# Patient Record
Sex: Female | Born: 1965
Health system: Southern US, Community
[De-identification: ages and names within clinical notes are randomized; demographics above are authoritative.]

## PROBLEM LIST (undated history)

## (undated) DIAGNOSIS — E669 Obesity, unspecified: Secondary | ICD-10-CM

## (undated) DIAGNOSIS — E781 Pure hyperglyceridemia: Secondary | ICD-10-CM

## (undated) HISTORY — PX: MANDIBLE FRACTURE SURGERY: SHX706

## (undated) HISTORY — DX: Obesity, unspecified: E66.9

## (undated) HISTORY — PX: EYE SURGERY: SHX253

---
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
## 2014-05-28 LAB — HM PAP SMEAR

## 2015-09-26 LAB — HM COLONOSCOPY

## 2015-12-18 ENCOUNTER — Encounter (HOSPITAL_COMMUNITY): Payer: Self-pay | Admitting: *Deleted

## 2015-12-18 ENCOUNTER — Emergency Department (HOSPITAL_COMMUNITY)
Admission: EM | Admit: 2015-12-18 | Discharge: 2015-12-18 | Disposition: A | Discharge status: Home / Self Care | Payer: BLUE CROSS/BLUE SHIELD | Attending: Emergency Medicine | Admitting: Emergency Medicine

## 2015-12-18 ENCOUNTER — Emergency Department (HOSPITAL_COMMUNITY): Payer: BLUE CROSS/BLUE SHIELD

## 2015-12-18 DIAGNOSIS — R079 Chest pain, unspecified: Secondary | ICD-10-CM | POA: Diagnosis present

## 2015-12-18 LAB — I-STAT TROPONIN, ED
TROPONIN I, POC: 0 ng/mL (ref 0.00–0.08)
Troponin i, poc: 0 ng/mL (ref 0.00–0.08)

## 2015-12-18 LAB — CBC
HEMATOCRIT: 41.8 % (ref 36.0–46.0)
HEMOGLOBIN: 13.7 g/dL (ref 12.0–15.0)
MCH: 29 pg (ref 26.0–34.0)
MCHC: 32.8 g/dL (ref 30.0–36.0)
MCV: 88.4 fL (ref 78.0–100.0)
Platelets: 257 10*3/uL (ref 150–400)
RBC: 4.73 MIL/uL (ref 3.87–5.11)
RDW: 12.8 % (ref 11.5–15.5)
WBC: 8.6 10*3/uL (ref 4.0–10.5)

## 2015-12-18 LAB — BASIC METABOLIC PANEL
ANION GAP: 8 (ref 5–15)
BUN: 13 mg/dL (ref 6–20)
CO2: 26 mmol/L (ref 22–32)
Calcium: 9.6 mg/dL (ref 8.9–10.3)
Chloride: 109 mmol/L (ref 101–111)
Creatinine, Ser: 0.67 mg/dL (ref 0.44–1.00)
GFR calc Af Amer: >60 mL/min (ref >60)
Glucose, Bld: 123 mg/dL — ABNORMAL HIGH (ref 65–99)
POTASSIUM: 3.7 mmol/L (ref 3.5–5.1)
SODIUM: 143 mmol/L (ref 135–145)

## 2015-12-18 NOTE — Discharge Instructions (Signed)

## 2015-12-18 NOTE — ED Triage Notes (Signed)
Pt states she woke up with of discomfort in the middle of her chest and it will not go away.

## 2015-12-18 NOTE — ED Provider Notes (Signed)
Emergency Department Provider Note  Time seen: Approximately 7:42 AM  I have reviewed the triage vital signs and the nursing notes.   HISTORY  Chief Complaint Chest Pain   HPI Catherine Haley is a 50 y.o. female with no significant past medical history presents to the emergency department for evaluation of chest pressure that woke the patient from sleep at approximately 4 AM today. Patient states she awoke with a central chest pressure that was nonradiating. It continued despite taking Rolaids. She denies any associated dyspnea, diaphoresis, jaw or neck symptoms. No prior history of similar pain. Patient is a nonsmoker. She does have a positive family history of acute MI. She reports symptoms spontaneously resolved in route to the emergency department. She has not had a return of chest pressure. No symptom worsening with walking or other minimal exertion.   History reviewed. No pertinent past medical history.  There are no active problems to display for this patient.   Past Surgical History:  Procedure Laterality Date  . MANDIBLE FRACTURE SURGERY      Current Outpatient Rx  . Order #: [id number] Class: Historical Med    Allergies Review of patient's allergies indicates no known allergies.  No family history on file.  Social History Social History  Substance Use Topics  . Smoking status: Never Smoker  . Smokeless tobacco: Not on file  . Alcohol use No    Review of Systems  Constitutional: No fever/chills Eyes: No visual changes. ENT: No sore throat. Cardiovascular: Positive chest pain. Respiratory: Denies shortness of breath. Gastrointestinal: No abdominal pain.  No nausea, no vomiting.  No diarrhea.  No constipation. Genitourinary: Negative for dysuria. Musculoskeletal: Negative for back pain. Skin: Negative for rash. Neurological: Negative for headaches, focal weakness or numbness.  10-point ROS otherwise  negative.  ____________________________________________   PHYSICAL EXAM:  VITAL SIGNS: ED Triage Vitals  Enc Vitals Group     BP 12/18/15 0503 (!) 164/108     Pulse Rate 12/18/15 0503 63     Resp 12/18/15 0503 17     Temp 12/18/15 0503 98.3 F (36.8 C)     Temp Source 12/18/15 0503 Oral     SpO2 12/18/15 0503 96 %     Weight 12/18/15 0503 170 lb (77.1 kg)     Height 12/18/15 0503  (1.626 m)     Pain Score 12/18/15 0510 5   Constitutional: Alert and oriented. Well appearing and in no acute distress. Eyes: Conjunctivae are normal. PERRL. EOMI. Head: Atraumatic. Nose: No congestion/rhinnorhea. Mouth/Throat: Mucous membranes are moist.  Oropharynx non-erythematous. Neck: No stridor.   Cardiovascular: Normal rate, regular rhythm. Good peripheral circulation. Grossly normal heart sounds.   Respiratory: Normal respiratory effort.  No retractions. Lungs CTAB. Gastrointestinal: Soft and nontender. No distention.  Musculoskeletal: No lower extremity tenderness nor edema. No gross deformities of extremities. Neurologic:  Normal speech and language. No gross focal neurologic deficits are appreciated.  Skin:  Skin is warm, dry and intact. No rash noted. Psychiatric: Mood and affect are normal. Speech and behavior are normal.  ____________________________________________   LABS (all labs ordered are listed, but only abnormal results are displayed)  Labs Reviewed  BASIC METABOLIC PANEL - Abnormal; Notable for the following:       Result Value   Glucose, Bld 123 (*)    All other components within normal limits  CBC  I-STAT TROPOININ, ED  I-STAT TROPOININ, ED   ____________________________________________  EKG  Reviewed in MUSE. Normal EKG.  ____________________________________________  RADIOLOGY  Dg Chest 2 View  Result Date: 12/18/2015 CLINICAL DATA:  Chest pain beginning over night. EXAM: CHEST  2 VIEW COMPARISON:  None available for comparison at time of study  interpretation. FINDINGS: Cardiomediastinal silhouette is normal. The lungs are clear without pleural effusions or focal consolidations. Trachea projects midline and there is no pneumothorax. Soft tissue planes and included osseous structures are non-suspicious. IMPRESSION: No acute cardiopulmonary process. Electronically Signed   By: Awilda Metro M.D.   On: 12/18/2015 05:51   ____________________________________________   PROCEDURES  Procedure(s) performed:   Procedures  None ____________________________________________   INITIAL IMPRESSION / ASSESSMENT AND PLAN / ED COURSE  Pertinent labs & imaging results that were available during my care of the patient were reviewed by me and considered in my medical decision making (see chart for details).  Patient resents the emergency department for evaluation of chest pressure. She has risk factors for coronary artery disease including age and family history. HEART score 2. Pain resolved spontaneously prior to ED presentation. The patient has a normal EKG. She is currently pain-free. Plan for repeat troponin at 3 hour mark and reassessment.  Differential includes all life-threatening causes for chest pain. This includes but is not exclusive to acute coronary syndrome, aortic dissection, pulmonary embolism, cardiac tamponade, community-acquired pneumonia, pericarditis, musculoskeletal chest wall pain, etc.  Likely discharge with PCP follow up for consideration of outpatient stress if repeat troponin is negative.   09:11 AM Patient remains chest pain-free. Repeat troponin negative. Discussed return precautions for chest pain. Provide a cardiology follow-up as needed.  At this time, I do not feel there is any life-threatening condition present. I have reviewed and discussed all results (EKG, imaging, lab, urine as appropriate), exam findings with patient. I have reviewed nursing notes and appropriate previous records.  I feel the patient is  safe to be discharged home without further emergent workup. Discussed usual and customary return precautions. Patient and family (if present) verbalize understanding and are comfortable with this plan.  Patient will follow-up with their primary care provider. If they do not have a primary care provider, information for follow-up has been provided to them. All questions have been answered.  ____________________________________________  FINAL CLINICAL IMPRESSION(S) / ED DIAGNOSES  Final diagnoses:  Nonspecific chest pain     MEDICATIONS GIVEN DURING THIS VISIT:  None  NEW OUTPATIENT MEDICATIONS STARTED DURING THIS VISIT:  None   Note:  This document was prepared using Dragon voice recognition software and may include unintentional dictation errors.  Alona Bene, MD Emergency Medicine   Maia Plan, MD 12/18/15 Ernest[facility] [place]

## 2016-06-01 DIAGNOSIS — Z803 Family history of malignant neoplasm of breast: Secondary | ICD-10-CM | POA: Diagnosis not present

## 2016-06-01 DIAGNOSIS — Z1231 Encounter for screening mammogram for malignant neoplasm of breast: Secondary | ICD-10-CM | POA: Diagnosis not present

## 2016-07-03 DIAGNOSIS — M9902 Segmental and somatic dysfunction of thoracic region: Secondary | ICD-10-CM | POA: Diagnosis not present

## 2016-07-03 DIAGNOSIS — M9901 Segmental and somatic dysfunction of cervical region: Secondary | ICD-10-CM | POA: Diagnosis not present

## 2016-07-03 DIAGNOSIS — M50122 Cervical disc disorder at C5-C6 level with radiculopathy: Secondary | ICD-10-CM | POA: Diagnosis not present

## 2016-07-03 DIAGNOSIS — M5384 Other specified dorsopathies, thoracic region: Secondary | ICD-10-CM | POA: Diagnosis not present

## 2016-07-11 DIAGNOSIS — M7531 Calcific tendinitis of right shoulder: Secondary | ICD-10-CM | POA: Diagnosis not present

## 2016-07-12 DIAGNOSIS — M47814 Spondylosis without myelopathy or radiculopathy, thoracic region: Secondary | ICD-10-CM | POA: Diagnosis not present

## 2016-07-12 DIAGNOSIS — M25511 Pain in right shoulder: Secondary | ICD-10-CM | POA: Diagnosis not present

## 2016-10-03 DIAGNOSIS — Z7189 Other specified counseling: Secondary | ICD-10-CM | POA: Diagnosis not present

## 2017-01-14 DIAGNOSIS — H9201 Otalgia, right ear: Secondary | ICD-10-CM | POA: Diagnosis not present

## 2017-05-28 LAB — HM MAMMOGRAPHY

## 2017-06-03 DIAGNOSIS — Z1231 Encounter for screening mammogram for malignant neoplasm of breast: Secondary | ICD-10-CM | POA: Diagnosis not present

## 2017-06-03 DIAGNOSIS — Z803 Family history of malignant neoplasm of breast: Secondary | ICD-10-CM | POA: Diagnosis not present

## 2017-07-17 DIAGNOSIS — Z712 Person consulting for explanation of examination or test findings: Secondary | ICD-10-CM | POA: Diagnosis not present

## 2017-07-17 DIAGNOSIS — Z23 Encounter for immunization: Secondary | ICD-10-CM | POA: Diagnosis not present

## 2017-11-06 DIAGNOSIS — M7581 Other shoulder lesions, right shoulder: Secondary | ICD-10-CM | POA: Diagnosis not present

## 2018-01-28 ENCOUNTER — Other Ambulatory Visit (HOSPITAL_COMMUNITY)
Admission: RE | Admit: 2018-01-28 | Discharge: 2018-01-28 | Disposition: A | Discharge status: Home / Self Care | Payer: BLUE CROSS/BLUE SHIELD | Source: Ambulatory Visit | Attending: Family Medicine | Admitting: Family Medicine

## 2018-01-28 ENCOUNTER — Other Ambulatory Visit: Payer: Self-pay

## 2018-01-28 ENCOUNTER — Encounter: Payer: Self-pay | Admitting: Family Medicine

## 2018-01-28 ENCOUNTER — Ambulatory Visit: Payer: BLUE CROSS/BLUE SHIELD | Admitting: Family Medicine

## 2018-01-28 VITALS — BP 120/86 | HR 76 | Temp 98.1°F | Ht 64.5 in | Wt 189.0 lb

## 2018-01-28 DIAGNOSIS — E669 Obesity, unspecified: Secondary | ICD-10-CM | POA: Diagnosis not present

## 2018-01-28 DIAGNOSIS — Z124 Encounter for screening for malignant neoplasm of cervix: Secondary | ICD-10-CM

## 2018-01-28 DIAGNOSIS — Z0000 Encounter for general adult medical examination without abnormal findings: Secondary | ICD-10-CM

## 2018-01-28 HISTORY — DX: Obesity, unspecified: E66.9

## 2018-01-28 LAB — COMPREHENSIVE METABOLIC PANEL
ALT: 33 U/L (ref 0–35)
AST: 24 U/L (ref 0–37)
Albumin: 4.8 g/dL (ref 3.5–5.2)
Alkaline Phosphatase: 72 U/L (ref 39–117)
BUN: 12 mg/dL (ref 6–23)
CHLORIDE: 104 meq/L (ref 96–112)
CO2: 28 meq/L (ref 19–32)
Calcium: 10.4 mg/dL (ref 8.4–10.5)
Creatinine, Ser: 0.72 mg/dL (ref 0.40–1.20)
GFR: 89.93 mL/min (ref >60.00)
GLUCOSE: 92 mg/dL (ref 70–99)
POTASSIUM: 4.3 meq/L (ref 3.5–5.1)
SODIUM: 141 meq/L (ref 135–145)
TOTAL PROTEIN: 7.1 g/dL (ref 6.0–8.3)
Total Bilirubin: 0.6 mg/dL (ref 0.2–1.2)

## 2018-01-28 LAB — CBC WITH DIFFERENTIAL/PLATELET
Basophils Absolute: 0.1 10*3/uL (ref 0.0–0.1)
Basophils Relative: 0.5 % (ref 0.0–3.0)
EOS PCT: 1.8 % (ref 0.0–5.0)
Eosinophils Absolute: 0.2 10*3/uL (ref 0.0–0.7)
HCT: 41.5 % (ref 36.0–46.0)
Hemoglobin: 14.3 g/dL (ref 12.0–15.0)
LYMPHS ABS: 3 10*3/uL (ref 0.7–4.0)
Lymphocytes Relative: 29.8 % (ref 12.0–46.0)
MCHC: 34.5 g/dL (ref 30.0–36.0)
MCV: 85.5 fl (ref 78.0–100.0)
MONO ABS: 0.9 10*3/uL (ref 0.1–1.0)
Monocytes Relative: 9.5 % (ref 3.0–12.0)
NEUTROS ABS: 5.8 10*3/uL (ref 1.4–7.7)
NEUTROS PCT: 58.4 % (ref 43.0–77.0)
PLATELETS: 318 10*3/uL (ref 150.0–400.0)
RBC: 4.85 Mil/uL (ref 3.87–5.11)
RDW: 13.7 % (ref 11.5–15.5)
WBC: 10 10*3/uL (ref 4.0–10.5)

## 2018-01-28 LAB — LIPID PANEL
CHOL/HDL RATIO: 5
Cholesterol: 170 mg/dL (ref 0–200)
HDL: 31.6 mg/dL — AB (ref >39.00)
NonHDL: 138.76
Triglycerides: 268 mg/dL — ABNORMAL HIGH (ref 0.0–149.0)
VLDL: 53.6 mg/dL — AB (ref 0.0–40.0)

## 2018-01-28 LAB — LDL CHOLESTEROL, DIRECT: LDL DIRECT: 119 mg/dL

## 2018-01-28 NOTE — Patient Instructions (Signed)
Please return in 12 months for your annual complete physical; please come fasting.  I will release your lab results to you on your MyChart account with further instructions. Please reply with any questions.    It was a pleasure meeting you today! Thank you for choosing us to meet your healthcare needs! I truly look forward to working with you. If you have any questions or concerns, please send me a message via Mychart or call the office at [phone].  Please do these things to maintain good health!   Exercise at least 30-45 minutes a day,  4-5 days a week.   Eat a low-fat diet with lots of fruits and vegetables, up to 7-9 servings per day.  Drink plenty of water daily. Try to drink 8 8oz glasses per day.  Seatbelts can save your life. Always wear your seatbelt.  Place Smoke Detectors on every level of your home and check batteries every year.  Schedule an appointment with an eye doctor for an eye exam every 1-2 years  Safe sex - use condoms to protect yourself from STDs if you could be exposed to these types of infections. Use birth control if you do not want to become pregnant and are sexually active.  Avoid heavy alcohol use. If you drink, keep it to less than 2 drinks/day and not every day.  Health Care Power of Attorney.  Choose someone you trust that could speak for you if you became unable to speak for yourself.  Depression is common in our stressful world.If you're feeling down or losing interest in things you normally enjoy, please come in for a visit.  If anyone is threatening or hurting you, please get help. Physical or Emotional Violence is never OK.  

## 2018-01-28 NOTE — Progress Notes (Signed)
Subjective  Chief Complaint  Patient presents with  . Establish Care    Last saw Andria Meuse at [place], Last physical in 2016  . Annual Exam    Pap today, Patient is not fasting, declines flu shot     HPI: Catherine Haley is a 52 y.o. female who presents to [facility] today for a Female Wellness Visit.   Wellness Visit: annual visit with health maintenance review and exam with Pap   Happy healthy 52 yo postmenopausal married female for cpe with pap   Moved back to [place] from [place] 3 years ago. No health concerns.   Declines flu vaccinations at this time.   Reports nl colonoscopy in 2017; not certain where she had it done.   Assessment  1. Annual physical exam   2. Cervical cancer screening   3. Obesity (BMI 30-39.9)      Plan  Female Wellness Visit:  Age appropriate Health Maintenance and Prevention measures were discussed with patient. Included topics are cancer screening recommendations, ways to keep healthy (see AVS) including dietary and exercise recommendations, regular eye and dental care, use of seat belts, and avoidance of moderate alcohol use and tobacco use. Pap with hpv [place] done. mammo nl. crc screen up to date  BMI: discussed patient's BMI and encouraged positive lifestyle modifications to help get to or maintain a target BMI.  HM needs and immunizations were addressed and ordered. See below for orders. See HM and immunization section for updates.  Routine labs and screening tests ordered including cmp, cbc and lipids where appropriate.  Discussed recommendations regarding Vit D and calcium supplementation (see AVS)  Follow up: Return in about 1 year (around 01/29/2019) for complete physical.   Orders Placed This Encounter  Procedures  . HM MAMMOGRAPHY  . HM PAP SMEAR  . CBC with Differential/Platelet  . Comprehensive metabolic panel  . Lipid panel  . HIV antibody  . HM COLONOSCOPY   No orders of the defined types were  placed in this encounter.    Lifestyle: Body mass index is 31.94 kg/m. Wt Readings from Last 3 Encounters:  01/28/18 189 lb (85.7 kg)  12/18/15 170 lb (77.1 kg)   Diet: general Exercise: intermittently,   Patient Active Problem List   Diagnosis Date Noted  . Obesity (BMI 30-39.9) 01/28/2018   Health Maintenance  Topic Date Due  . HIV Screening  09/[birth year]/1979  . PAP SMEAR  05/28/2017  . INFLUENZA VACCINE  09/17/2018 (Originally 12/26/2017)  . MAMMOGRAM  05/28/2018  . COLONOSCOPY  09/25/2025  . TETANUS/TDAP  06/29/2027   Immunization History  Administered Date(s) Administered  . Tdap 06/28/2017   We updated and reviewed the patient's past history in detail and it is documented below. Allergies: Patient has No Known Allergies. Past Medical History Patient  has a past medical history of Obesity (BMI 30-39.9) (01/28/2018). Past Surgical History Patient  has a past surgical history that includes Mandible fracture surgery. Family History: Patient family history includes Alcohol abuse in her paternal grandmother; Arthritis in her father; Breast cancer in her mother; Colon cancer (age of onset: 31) in her father; Diverticulitis in her mother; Heart disease in her maternal grandfather, maternal grandmother, paternal grandfather, and paternal grandmother; Heart disease (age of onset: 30) in her father; Heart disease (age of onset: 63) in her mother; Hyperlipidemia in her father; Hypertension in her brother and father. Social History:  Patient  reports that she has never smoked. She has never used smokeless  tobacco. She reports that she does not drink alcohol or use drugs.  Review of Systems: Constitutional: negative for fever or malaise Ophthalmic: negative for photophobia, double vision or loss of vision Cardiovascular: negative for chest pain, dyspnea on exertion, or new LE swelling Respiratory: negative for SOB or persistent cough Gastrointestinal: negative for abdominal pain, change  in bowel habits or melena Genitourinary: negative for dysuria or gross hematuria, no abnormal uterine bleeding or disharge Musculoskeletal: negative for new gait disturbance or muscular weakness Integumentary: negative for new or persistent rashes, no breast lumps Neurological: negative for TIA or stroke symptoms Psychiatric: negative for SI or delusions Allergic/Immunologic: negative for hives  Patient Care Team    Relationship Specialty Notifications Start End  Willow Ora, MD PCP - General Family Medicine  01/28/18   Mammography, [facility]  Diagnostic Radiology  01/28/18     Objective  Vitals: BP 120/86   Pulse 76   Temp 98.1 F (36.7 C)   Ht 5' 4.5" (1.638 m)   Wt 189 lb (85.7 kg)   SpO2 98%   BMI 31.94 kg/m  General:  Well developed, well nourished, no acute distress  Psych:  Alert and orientedx3,normal mood and affect HEENT:  Normocephalic, atraumatic, non-icteric sclera, PERRL, oropharynx is clear without mass or exudate, supple neck without adenopathy, mass or thyromegaly Cardiovascular:  Normal S1, S2, RRR without gallop, rub or murmur, nondisplaced PMI Respiratory:  Good breath sounds bilaterally, CTAB with normal respiratory effort Gastrointestinal: normal bowel sounds, soft, non-tender, no noted masses. No HSM MSK: no deformities, contusions. Joints are without erythema or swelling. Spine and CVA region are nontender Skin:  Warm, no rashes or suspicious lesions noted Neurologic:    Mental status is normal. CN 2-11 are normal. Gross motor and sensory exams are normal. Normal gait. No tremor Breast Exam: No mass, skin retraction or nipple discharge is appreciated in either breast. No axillary adenopathy. Fibrocystic changes are not noted Pelvic Exam: Normal external genitalia, no vulvar or vaginal lesions present. Clear stenotic cervix w/o CMT. Bimanual exam reveals a nontender fundus w/o masses, nl size. No adnexal masses present. No inguinal adenopathy. A PAP smear was  performed.   Commons side effects, risks, benefits, and alternatives for medications and treatment plan prescribed today were discussed, and the patient expressed understanding of the given instructions. Patient is instructed to call or message via MyChart if he/she has any questions or concerns regarding our treatment plan. No barriers to understanding were identified. We discussed Red Flag symptoms and signs in detail. Patient expressed understanding regarding what to do in case of urgent or emergency type symptoms.   Medication list was reconciled, printed and provided to the patient in AVS. Patient instructions and summary information was reviewed with the patient as documented in the AVS. This note was prepared with assistance of Dragon voice recognition software. Occasional wrong-word or sound-a-like substitutions may have occurred due to the inherent limitations of voice recognition software

## 2018-01-29 LAB — CYTOLOGY - PAP
Diagnosis: NEGATIVE
HPV: NOT DETECTED

## 2018-01-29 LAB — HIV ANTIBODY (ROUTINE TESTING W REFLEX): HIV 1&2 Ab, 4th Generation: NONREACTIVE

## 2018-01-29 NOTE — Progress Notes (Signed)
Please call patient: I have reviewed his/her lab results. Everything looks good. Pap smear is normal and HPV testing is negative so can recheck in 5 years. Labs are normal although a low fat diet is recommended to avoid increasing cholesterol levels. Her levels are currently OK although triglycerides were a little high.

## 2018-03-01 DIAGNOSIS — J00 Acute nasopharyngitis [common cold]: Secondary | ICD-10-CM | POA: Diagnosis not present

## 2018-06-06 DIAGNOSIS — Z803 Family history of malignant neoplasm of breast: Secondary | ICD-10-CM | POA: Diagnosis not present

## 2018-06-06 DIAGNOSIS — Z1231 Encounter for screening mammogram for malignant neoplasm of breast: Secondary | ICD-10-CM | POA: Diagnosis not present

## 2018-06-06 LAB — HM MAMMOGRAPHY

## 2018-06-09 ENCOUNTER — Encounter: Payer: Self-pay | Admitting: *Deleted

## 2018-07-26 DIAGNOSIS — R05 Cough: Secondary | ICD-10-CM | POA: Diagnosis not present

## 2018-07-26 DIAGNOSIS — J069 Acute upper respiratory infection, unspecified: Secondary | ICD-10-CM | POA: Diagnosis not present

## 2019-01-28 ENCOUNTER — Telehealth: Payer: Self-pay | Admitting: Family Medicine

## 2019-01-28 NOTE — Telephone Encounter (Signed)
Pt is healthy, she can be scheduled in a 45min spot

## 2019-01-28 NOTE — Telephone Encounter (Signed)
Copied from [place] [id number]. Topic: General - Inquiry >> Jan 28, 2019 11:48 AM Richardo Priest, NT wrote: Reason for CRM: Patient called in and physical for 9/4 has been rescheduled to 11/2. Patient is wanting a call as she is not wanting to wait that long and would prefer to come in the morning. Please advise if sooner appointment is available.    Would Dr. Jonni Sanger allow me to either schedule a 20 minute appt or to schedule back to back physicals so we can work pt in sooner? She was originally scheduled for this Friday 9/4 at 8am and had to be rescheduled due to Dr. Tamela Oddi schedule change. Please let me know! Thank you

## 2019-01-30 ENCOUNTER — Encounter: Payer: BLUE CROSS/BLUE SHIELD | Admitting: Family Medicine

## 2019-02-05 ENCOUNTER — Other Ambulatory Visit: Payer: Self-pay

## 2019-02-05 ENCOUNTER — Ambulatory Visit (INDEPENDENT_AMBULATORY_CARE_PROVIDER_SITE_OTHER): Payer: BC Managed Care – PPO | Admitting: Family Medicine

## 2019-02-05 ENCOUNTER — Encounter: Payer: Self-pay | Admitting: Family Medicine

## 2019-02-05 ENCOUNTER — Ambulatory Visit: Payer: BC Managed Care – PPO | Admitting: Family Medicine

## 2019-02-05 VITALS — BP 128/76 | HR 70 | Temp 98.3°F | Resp 16 | Ht 64.5 in | Wt 188.8 lb

## 2019-02-05 DIAGNOSIS — Z0000 Encounter for general adult medical examination without abnormal findings: Secondary | ICD-10-CM

## 2019-02-05 LAB — LDL CHOLESTEROL, DIRECT: Direct LDL: 107 mg/dL

## 2019-02-05 LAB — CBC WITH DIFFERENTIAL/PLATELET
Basophils Absolute: 0 10*3/uL (ref 0.0–0.1)
Basophils Relative: 0.4 % (ref 0.0–3.0)
Eosinophils Absolute: 0.2 10*3/uL (ref 0.0–0.7)
Eosinophils Relative: 1.8 % (ref 0.0–5.0)
HCT: 41 % (ref 36.0–46.0)
Hemoglobin: 14 g/dL (ref 12.0–15.0)
Lymphocytes Relative: 35.2 % (ref 12.0–46.0)
Lymphs Abs: 3.1 10*3/uL (ref 0.7–4.0)
MCHC: 34.3 g/dL (ref 30.0–36.0)
MCV: 87.6 fl (ref 78.0–100.0)
Monocytes Absolute: 0.9 10*3/uL (ref 0.1–1.0)
Monocytes Relative: 9.7 % (ref 3.0–12.0)
Neutro Abs: 4.7 10*3/uL (ref 1.4–7.7)
Neutrophils Relative %: 52.9 % (ref 43.0–77.0)
Platelets: 315 10*3/uL (ref 150.0–400.0)
RBC: 4.68 Mil/uL (ref 3.87–5.11)
RDW: 13 % (ref 11.5–15.5)
WBC: 8.9 10*3/uL (ref 4.0–10.5)

## 2019-02-05 LAB — LIPID PANEL
Cholesterol: 164 mg/dL (ref 0–200)
HDL: 33 mg/dL — ABNORMAL LOW (ref >39.00)
NonHDL: 131.39
Total CHOL/HDL Ratio: 5
Triglycerides: 270 mg/dL — ABNORMAL HIGH (ref 0.0–149.0)
VLDL: 54 mg/dL — ABNORMAL HIGH (ref 0.0–40.0)

## 2019-02-05 LAB — COMPREHENSIVE METABOLIC PANEL
ALT: 24 U/L (ref 0–35)
AST: 20 U/L (ref 0–37)
Albumin: 4.4 g/dL (ref 3.5–5.2)
Alkaline Phosphatase: 83 U/L (ref 39–117)
BUN: 14 mg/dL (ref 6–23)
CO2: 29 mEq/L (ref 19–32)
Calcium: 9.6 mg/dL (ref 8.4–10.5)
Chloride: 105 mEq/L (ref 96–112)
Creatinine, Ser: 0.64 mg/dL (ref 0.40–1.20)
GFR: 96.56 mL/min (ref >60.00)
Glucose, Bld: 86 mg/dL (ref 70–99)
Potassium: 4.1 mEq/L (ref 3.5–5.1)
Sodium: 142 mEq/L (ref 135–145)
Total Bilirubin: 0.6 mg/dL (ref 0.2–1.2)
Total Protein: 6.9 g/dL (ref 6.0–8.3)

## 2019-02-05 NOTE — Progress Notes (Signed)
Subjective  Chief Complaint  Patient presents with  . Annual Exam    Fasting    HPI: Catherine Haley is a 53 y.o. female who presents to [facility] today for a Female Wellness Visit.   Wellness Visit: annual visit with health maintenance review and exam without Pap   HM: doing well. Happy, healthy. Needs note saying she is medically cleared for camping / hiking trip. Screens are up to date including recent eye exam.  Declines flu vaccines.   Assessment  1. Annual physical exam      Plan  Female Wellness Visit:  Age appropriate Health Maintenance and Prevention measures were discussed with patient. Included topics are cancer screening recommendations, ways to keep healthy (see AVS) including dietary and exercise recommendations, regular eye and dental care, use of seat belts, and avoidance of moderate alcohol use and tobacco use.   BMI: discussed patient's BMI and encouraged positive lifestyle modifications to help get to or maintain a target BMI.  HM needs and immunizations were addressed and ordered. See below for orders. See HM and immunization section for updates.  Routine labs and screening tests ordered including cmp, cbc and lipids where appropriate.  Discussed recommendations regarding Vit D and calcium supplementation (see AVS)  Follow up: Return in about 1 year (around 02/05/2020) for complete physical.   Orders Placed This Encounter  Procedures  . CBC with Differential/Platelet  . Comprehensive metabolic panel  . Lipid panel   No orders of the defined types were placed in this encounter.    Lifestyle: Body mass index is 31.91 kg/m. Wt Readings from Last 3 Encounters:  02/05/19 188 lb 12.8 oz (85.6 kg)  01/28/18 189 lb (85.7 kg)  12/18/15 170 lb (77.1 kg)     Patient Active Problem List   Diagnosis Date Noted  . Obesity (BMI 30-39.9) 01/28/2018   Health Maintenance  Topic Date Due  . MAMMOGRAM  06/07/2019  . PAP  SMEAR-Modifier  01/29/2023  . COLONOSCOPY  09/25/2025  . TETANUS/TDAP  06/29/2027  . HIV Screening  Completed  . INFLUENZA VACCINE  Discontinued   Immunization History  Administered Date(s) Administered  . Tdap 06/28/2017   We updated and reviewed the patient's past history in detail and it is documented below. Allergies: Patient has No Known Allergies. Past Medical History Patient  has a past medical history of Obesity (BMI 30-39.9) (01/28/2018). Past Surgical History Patient  has a past surgical history that includes Mandible fracture surgery. Family History: Patient family history includes Alcohol abuse in her paternal grandmother; Arthritis in her father; Breast cancer in her mother; Colon cancer (age of onset: 34) in her father; Diverticulitis in her mother; Heart disease in her maternal grandfather, maternal grandmother, paternal grandfather, and paternal grandmother; Heart disease (age of onset: 53) in her father; Heart disease (age of onset: 63) in her mother; Hyperlipidemia in her father; Hypertension in her brother and father. Social History:  Patient  reports that she has never smoked. She has never used smokeless tobacco. She reports that she does not drink alcohol or use drugs.  Review of Systems: Constitutional: negative for fever or malaise Ophthalmic: negative for photophobia, double vision or loss of vision Cardiovascular: negative for chest pain, dyspnea on exertion, or new LE swelling Respiratory: negative for SOB or persistent cough Gastrointestinal: negative for abdominal pain, change in bowel habits or melena Genitourinary: negative for dysuria or gross hematuria, no abnormal uterine bleeding or disharge Musculoskeletal: negative for new gait  disturbance or muscular weakness Integumentary: negative for new or persistent rashes, no breast lumps Neurological: negative for TIA or stroke symptoms Psychiatric: negative for SI or delusions Allergic/Immunologic: negative  for hives  Patient Care Team    Relationship Specialty Notifications Start End  Willow OraAndy,  L, MD PCP - General Family Medicine  01/28/18   Mammography, [facility]  Diagnostic Radiology  01/28/18     Objective  Vitals: BP 128/76   Pulse 70   Temp 98.3 F (36.8 C) (Tympanic)   Resp 16   Ht 5' 4.5" (1.638 m)   Wt 188 lb 12.8 oz (85.6 kg)   SpO2 98%   BMI 31.91 kg/m  General:  Well developed, well nourished, no acute distress  Psych:  Alert and orientedx3,normal mood and affect HEENT:  Normocephalic, atraumatic, non-icteric sclera, PERRL, oropharynx is clear without mass or exudate, supple neck without adenopathy, mass or thyromegaly Cardiovascular:  Normal S1, S2, RRR without gallop, rub or murmur, nondisplaced PMI Respiratory:  Good breath sounds bilaterally, CTAB with normal respiratory effort Gastrointestinal: normal bowel sounds, soft, non-tender, no noted masses. No HSM MSK: no deformities, contusions. Joints are without erythema or swelling. Spine and CVA region are nontender Skin:  Warm, no rashes or suspicious lesions noted Neurologic:    Mental status is normal. CN 2-11 are normal. Gross motor and sensory exams are normal. Normal gait. No tremor Breast Exam: No mass, skin retraction or nipple discharge is appreciated in either breast. No axillary adenopathy. Fibrocystic changes are not noted    Commons side effects, risks, benefits, and alternatives for medications and treatment plan prescribed today were discussed, and the patient expressed understanding of the given instructions. Patient is instructed to call or message via MyChart if he/she has any questions or concerns regarding our treatment plan. No barriers to understanding were identified. We discussed Red Flag symptoms and signs in detail. Patient expressed understanding regarding what to do in case of urgent or emergency type symptoms.   Medication list was reconciled, printed and provided to the patient in AVS. Patient  instructions and summary information was reviewed with the patient as documented in the AVS. This note was prepared with assistance of Dragon voice recognition software. Occasional wrong-word or sound-a-like substitutions may have occurred due to the inherent limitations of voice recognition software

## 2019-02-05 NOTE — Patient Instructions (Signed)
Please return in 12 months for your annual complete physical; please come fasting.  I will release your lab results to you on your MyChart account with further instructions. Please reply with any questions.    If you have any questions or concerns, please don't hesitate to send me a message via MyChart or call the office at [phone]. Thank you for visiting with [place] today! It's our pleasure caring for you.   Preventive Care 24-54 Years Old, Female Preventive care refers to visits with your health care provider and lifestyle choices that can promote health and wellness. This includes:  A yearly physical exam. This may also be called an annual well check.  Regular dental visits and eye exams.  Immunizations.  Screening for certain conditions.  Healthy lifestyle choices, such as eating a healthy diet, getting regular exercise, not using drugs or products that contain nicotine and tobacco, and limiting alcohol use. What can I expect for my preventive care visit? Physical exam Your health care provider will check your:  Height and weight. This may be used to calculate body mass index (BMI), which tells if you are at a healthy weight.  Heart rate and blood pressure.  Skin for abnormal spots. Counseling Your health care provider may ask you questions about your:  Alcohol, tobacco, and drug use.  Emotional well-being.  Home and relationship well-being.  Sexual activity.  Eating habits.  Work and work Statistician.  Method of birth control.  Menstrual cycle.  Pregnancy history. What immunizations do I need?  Influenza (flu) vaccine  This is recommended every year. Tetanus, diphtheria, and pertussis (Tdap) vaccine  You may need a Td booster every 10 years. Varicella (chickenpox) vaccine  You may need this if you have not been vaccinated. Zoster (shingles) vaccine  You may need this after age 72. Measles, mumps, and rubella (MMR) vaccine  You may need at least  one dose of MMR if you were born in 1957 or later. You may also need a second dose. Pneumococcal conjugate (PCV13) vaccine  You may need this if you have certain conditions and were not previously vaccinated. Pneumococcal polysaccharide (PPSV23) vaccine  You may need one or two doses if you smoke cigarettes or if you have certain conditions. Meningococcal conjugate (MenACWY) vaccine  You may need this if you have certain conditions. Hepatitis A vaccine  You may need this if you have certain conditions or if you travel or work in places where you may be exposed to hepatitis A. Hepatitis B vaccine  You may need this if you have certain conditions or if you travel or work in places where you may be exposed to hepatitis B. Haemophilus influenzae type b (Hib) vaccine  You may need this if you have certain conditions. Human papillomavirus (HPV) vaccine  If recommended by your health care provider, you may need three doses over 6 months. You may receive vaccines as individual doses or as more than one vaccine together in one shot (combination vaccines). Talk with your health care provider about the risks and benefits of combination vaccines. What tests do I need? Blood tests  Lipid and cholesterol levels. These may be checked every 5 years, or more frequently if you are over 74 years old.  Hepatitis C test.  Hepatitis B test. Screening  Lung cancer screening. You may have this screening every year starting at age 77 if you have a 30-pack-year history of smoking and currently smoke or have quit within the past 15 years.  Colorectal  cancer screening. All adults should have this screening starting at age 50 and continuing until age 75. Your health care provider may recommend screening at age 45 if you are at increased risk. You will have tests every 1-10 years, depending on your results and the type of screening test.  Diabetes screening. This is done by checking your blood sugar (glucose)  after you have not eaten for a while (fasting). You may have this done every 1-3 years.  Mammogram. This may be done every 1-2 years. Talk with your health care provider about when you should start having regular mammograms. This may depend on whether you have a family history of breast cancer.  BRCA-related cancer screening. This may be done if you have a family history of breast, ovarian, tubal, or peritoneal cancers.  Pelvic exam and Pap test. This may be done every 3 years starting at age 21. Starting at age 30, this may be done every 5 years if you have a Pap test in combination with an HPV test. Other tests  Sexually transmitted disease (STD) testing.  Bone density scan. This is done to screen for osteoporosis. You may have this scan if you are at high risk for osteoporosis. Follow these instructions at home: Eating and drinking  Eat a diet that includes fresh fruits and vegetables, whole grains, lean protein, and low-fat dairy.  Take vitamin and mineral supplements as recommended by your health care provider.  Do not drink alcohol if: ? Your health care provider tells you not to drink. ? You are pregnant, may be pregnant, or are planning to become pregnant.  If you drink alcohol: ? Limit how much you have to 0-1 drink a day. ? Be aware of how much alcohol is in your drink. In the [place] one drink equals one 12 oz bottle of beer (355 mL), one 5 oz glass of wine (148 mL), or one 1 oz glass of hard liquor (44 mL). Lifestyle  Take daily care of your teeth and gums.  Stay active. Exercise for at least 30 minutes on 5 or more days each week.  Do not use any products that contain nicotine or tobacco, such as cigarettes, e-cigarettes, and chewing tobacco. If you need help quitting, ask your health care provider.  If you are sexually active, practice safe sex. Use a condom or other form of birth control (contraception) in order to prevent pregnancy and STIs (sexually transmitted  infections).  If told by your health care provider, take low-dose aspirin daily starting at age 50. What's next?  Visit your health care provider once a year for a well check visit.  Ask your health care provider how often you should have your eyes and teeth checked.  Stay up to date on all vaccines. This information is not intended to replace advice given to you by your health care provider. Make sure you discuss any questions you have with your health care provider. Document Released: 06/10/2015 Document Revised: 01/23/2018 Document Reviewed: 01/23/2018 Elsevier Patient Education  2020 [employer]   

## 2019-02-09 ENCOUNTER — Encounter: Payer: Self-pay | Admitting: Family Medicine

## 2019-02-09 DIAGNOSIS — E785 Hyperlipidemia, unspecified: Secondary | ICD-10-CM | POA: Insufficient documentation

## 2019-02-09 DIAGNOSIS — E781 Pure hyperglyceridemia: Secondary | ICD-10-CM | POA: Insufficient documentation

## 2019-02-09 HISTORY — DX: Pure hyperglyceridemia: E78.1

## 2019-03-30 ENCOUNTER — Encounter: Payer: BC Managed Care – PPO | Admitting: Family Medicine

## 2019-06-08 DIAGNOSIS — Z1231 Encounter for screening mammogram for malignant neoplasm of breast: Secondary | ICD-10-CM | POA: Diagnosis not present

## 2019-06-08 LAB — HM MAMMOGRAPHY

## 2019-06-15 ENCOUNTER — Encounter: Payer: Self-pay | Admitting: Family Medicine

## 2019-09-26 DIAGNOSIS — Z20822 Contact with and (suspected) exposure to covid-19: Secondary | ICD-10-CM | POA: Diagnosis not present

## 2020-02-12 ENCOUNTER — Encounter: Payer: BC Managed Care – PPO | Admitting: Family Medicine

## 2020-03-28 ENCOUNTER — Encounter: Payer: BC Managed Care – PPO | Admitting: Family Medicine

## 2020-06-24 DIAGNOSIS — Z1231 Encounter for screening mammogram for malignant neoplasm of breast: Secondary | ICD-10-CM | POA: Diagnosis not present

## 2020-06-24 LAB — HM MAMMOGRAPHY

## 2020-07-04 ENCOUNTER — Ambulatory Visit (INDEPENDENT_AMBULATORY_CARE_PROVIDER_SITE_OTHER): Payer: BC Managed Care – PPO | Admitting: Family Medicine

## 2020-07-04 ENCOUNTER — Encounter: Payer: Self-pay | Admitting: Family Medicine

## 2020-07-04 ENCOUNTER — Other Ambulatory Visit: Payer: Self-pay

## 2020-07-04 VITALS — BP 154/86 | HR 72 | Temp 97.7°F | Ht 65.0 in | Wt 191.4 lb

## 2020-07-04 DIAGNOSIS — Z1159 Encounter for screening for other viral diseases: Secondary | ICD-10-CM | POA: Diagnosis not present

## 2020-07-04 DIAGNOSIS — Z0000 Encounter for general adult medical examination without abnormal findings: Secondary | ICD-10-CM | POA: Diagnosis not present

## 2020-07-04 DIAGNOSIS — Z0001 Encounter for general adult medical examination with abnormal findings: Secondary | ICD-10-CM

## 2020-07-04 DIAGNOSIS — L089 Local infection of the skin and subcutaneous tissue, unspecified: Secondary | ICD-10-CM | POA: Diagnosis not present

## 2020-07-04 LAB — LIPID PANEL
Cholesterol: 183 mg/dL (ref 0–200)
HDL: 33.7 mg/dL — ABNORMAL LOW (ref >39.00)
NonHDL: 149.43
Total CHOL/HDL Ratio: 5
Triglycerides: 270 mg/dL — ABNORMAL HIGH (ref 0.0–149.0)
VLDL: 54 mg/dL — ABNORMAL HIGH (ref 0.0–40.0)

## 2020-07-04 LAB — COMPREHENSIVE METABOLIC PANEL
ALT: 43 U/L — ABNORMAL HIGH (ref 0–35)
AST: 24 U/L (ref 0–37)
Albumin: 4.5 g/dL (ref 3.5–5.2)
Alkaline Phosphatase: 72 U/L (ref 39–117)
BUN: 12 mg/dL (ref 6–23)
CO2: 29 mEq/L (ref 19–32)
Calcium: 9.9 mg/dL (ref 8.4–10.5)
Chloride: 103 mEq/L (ref 96–112)
Creatinine, Ser: 0.64 mg/dL (ref 0.40–1.20)
GFR: 99.23 mL/min (ref >60.00)
Glucose, Bld: 94 mg/dL (ref 70–99)
Potassium: 4 mEq/L (ref 3.5–5.1)
Sodium: 140 mEq/L (ref 135–145)
Total Bilirubin: 0.6 mg/dL (ref 0.2–1.2)
Total Protein: 7.2 g/dL (ref 6.0–8.3)

## 2020-07-04 LAB — CBC WITH DIFFERENTIAL/PLATELET
Basophils Absolute: 0.1 10*3/uL (ref 0.0–0.1)
Basophils Relative: 0.6 % (ref 0.0–3.0)
Eosinophils Absolute: 0.2 10*3/uL (ref 0.0–0.7)
Eosinophils Relative: 1.7 % (ref 0.0–5.0)
HCT: 40.2 % (ref 36.0–46.0)
Hemoglobin: 13.4 g/dL (ref 12.0–15.0)
Lymphocytes Relative: 25.5 % (ref 12.0–46.0)
Lymphs Abs: 2.9 10*3/uL (ref 0.7–4.0)
MCHC: 33.4 g/dL (ref 30.0–36.0)
MCV: 87.3 fl (ref 78.0–100.0)
Monocytes Absolute: 1 10*3/uL (ref 0.1–1.0)
Monocytes Relative: 8.9 % (ref 3.0–12.0)
Neutro Abs: 7.3 10*3/uL (ref 1.4–7.7)
Neutrophils Relative %: 63.3 % (ref 43.0–77.0)
Platelets: 309 10*3/uL (ref 150.0–400.0)
RBC: 4.6 Mil/uL (ref 3.87–5.11)
RDW: 13.4 % (ref 11.5–15.5)
WBC: 11.5 10*3/uL — ABNORMAL HIGH (ref 4.0–10.5)

## 2020-07-04 LAB — LDL CHOLESTEROL, DIRECT: Direct LDL: 134 mg/dL

## 2020-07-04 MED ORDER — DOXYCYCLINE HYCLATE 100 MG PO TABS: 100.0000 mg | ORAL_TABLET | Freq: Two times a day (BID) | ORAL | 0 refills | Status: AC

## 2020-07-04 NOTE — Progress Notes (Signed)
Subjective  Chief Complaint  Patient presents with  . Annual Exam    Not fasting    HPI: Catherine Haley is a 55 y.o. female who presents to [facility] today for a Female Wellness Visit. She also has the concerns and/or needs as listed above in the chief complaint. These will be addressed in addition to the Health Maintenance Visit.   Wellness Visit: annual visit with health maintenance review and exam without Pap   HM: up to date. Eligible for shingrix.  Healthy without any chronic medical problems.  Feels well.  Has been traveling. Chronic disease f/u and/or acute problem visit: (deemed necessary to be done in addition to the wellness visit):  Back in November had significant scalp rash.  Treated by telehealth visit.  Treated with clobetasol.  Since complains of pustules underneath her arms at times.  She thought more related to heat rash.  No fevers  Assessment  1. Annual physical exam   2. Need for hepatitis C screening test   3. Skin pustule      Plan  Female Wellness Visit:  Age appropriate Health Maintenance and Prevention measures were discussed with patient. Included topics are cancer screening recommendations, ways to keep healthy (see AVS) including dietary and exercise recommendations, regular eye and dental care, use of seat belts, and avoidance of moderate alcohol use and tobacco use.  Screens up-to-date  BMI: discussed patient's BMI and encouraged positive lifestyle modifications to help get to or maintain a target BMI.  HM needs and immunizations were addressed and ordered. See below for orders. See HM and immunization section for updates.  Eligible for Shingrix.  Routine labs and screening tests ordered including cmp, cbc and lipids where appropriate.  Discussed recommendations regarding Vit D and calcium supplementation (see AVS)  Chronic disease management visit and/or acute problem visit:  Skin pustules: Treat with  doxycycline. Follow up: Return in about 1 year (around 07/04/2021) for complete physical.  Orders Placed This Encounter  Procedures  . CBC with Differential/Platelet  . Comprehensive metabolic panel  . Lipid panel  . Hepatitis C antibody   Meds ordered this encounter  Medications  . doxycycline (VIBRA-TABS) 100 MG tablet    Sig: Take 1 tablet (100 mg total) by mouth 2 (two) times daily for 7 days.    Dispense:  14 tablet    Refill:  0      Body mass index is 31.85 kg/m. Wt Readings from Last 3 Encounters:  07/04/20 191 lb 6.4 oz (86.8 kg)  02/05/19 188 lb 12.8 oz (85.6 kg)  01/28/18 189 lb (85.7 kg)     Patient Active Problem List   Diagnosis Date Noted  . Hypertriglyceridemia 02/09/2019    Mild; rec fish oil bid 01/2019   . Obesity (BMI 30-39.9) 01/28/2018   Health Maintenance  Topic Date Due  . Hepatitis C Screening  Never done  . MAMMOGRAM  06/24/2021  . PAP SMEAR-Modifier  01/29/2023  . COLONOSCOPY (Pts 45-67yrs Insurance coverage will need to be confirmed)  09/25/2025  . TETANUS/TDAP  06/29/2027  . COVID-19 Vaccine  Completed  . HIV Screening  Completed  . INFLUENZA VACCINE  Discontinued   Immunization History  Administered Date(s) Administered  . Moderna Sars-Covid-2 Vaccination 08/12/2019, 09/09/2019, 05/30/2020  . Tdap 06/28/2017   We updated and reviewed the patient's past history in detail and it is documented below. Allergies: Patient has No Known Allergies. Past Medical History Patient  has a past medical  history of Hypertriglyceridemia (02/09/2019) and Obesity (BMI 30-39.9) (01/28/2018). Past Surgical History Patient  has a past surgical history that includes Mandible fracture surgery. Family History: Patient family history includes Alcohol abuse in her paternal grandmother; Arthritis in her father; Breast cancer in her mother; Colon cancer (age of onset: 51) in her father; Diverticulitis in her mother; Heart disease in her maternal grandfather,  maternal grandmother, paternal grandfather, and paternal grandmother; Heart disease (age of onset: 84) in her father; Heart disease (age of onset: 75) in her mother; Hyperlipidemia in her father; Hypertension in her brother and father. Social History:  Patient  reports that she has never smoked. She has never used smokeless tobacco. She reports that she does not drink alcohol and does not use drugs.  Review of Systems: Constitutional: negative for fever or malaise Ophthalmic: negative for photophobia, double vision or loss of vision Cardiovascular: negative for chest pain, dyspnea on exertion, or new LE swelling Respiratory: negative for SOB or persistent cough Gastrointestinal: negative for abdominal pain, change in bowel habits or melena Genitourinary: negative for dysuria or gross hematuria, no abnormal uterine bleeding or disharge Musculoskeletal: negative for new gait disturbance or muscular weakness Integumentary: negative for new or persistent rashes, no breast lumps Neurological: negative for TIA or stroke symptoms Psychiatric: negative for SI or delusions Allergic/Immunologic: negative for hives  Patient Care Team    Relationship Specialty Notifications Start End  Willow Ora, MD PCP - General Family Medicine  01/28/18   Mammography, [facility]  Diagnostic Radiology  01/28/18     Objective  Vitals: BP (!) 154/86   Pulse 72   Temp 97.7 F (36.5 C) (Temporal)   Ht 5\' 5"  (1.651 m)   Wt 191 lb 6.4 oz (86.8 kg)   SpO2 94%   BMI 31.85 kg/m  General:  Well developed, well nourished, no acute distress  Psych:  Alert and orientedx3,normal mood and affect HEENT:  Normocephalic, atraumatic, non-icteric sclera,  supple neck without adenopathy, mass or thyromegaly Cardiovascular:  Normal S1, S2, RRR without gallop, rub or murmur Respiratory:  Good breath sounds bilaterally, CTAB with normal respiratory effort Gastrointestinal: normal bowel sounds, soft, non-tender, no noted masses. No  HSM MSK: no deformities, contusions. Joints are without erythema or swelling.  Skin:  Warm, bilateral axilla with multiple papules and pustules.  No abscess or fluctuance. Neurologic:    Mental status is normal. CN 2-11 are normal. Gross motor and sensory exams are normal. Normal gait. No tremor Breast Exam: No mass, skin retraction or nipple discharge is appreciated in either breast. No axillary adenopathy. Fibrocystic changes are not noted    Commons side effects, risks, benefits, and alternatives for medications and treatment plan prescribed today were discussed, and the patient expressed understanding of the given instructions. Patient is instructed to call or message via MyChart if he/she has any questions or concerns regarding our treatment plan. No barriers to understanding were identified. We discussed Red Flag symptoms and signs in detail. Patient expressed understanding regarding what to do in case of urgent or emergency type symptoms.   Medication list was reconciled, printed and provided to the patient in AVS. Patient instructions and summary information was reviewed with the patient as documented in the AVS. This note was prepared with assistance of Dragon voice recognition software. Occasional wrong-word or sound-a-like substitutions may have occurred due to the inherent limitations of voice recognition software  This visit occurred during the SARS-CoV-2 public health emergency.  Safety protocols were in place, including screening  questions prior to the visit, additional usage of staff PPE, and extensive cleaning of exam room while observing appropriate contact time as indicated for disinfecting solutions.

## 2020-07-04 NOTE — Patient Instructions (Addendum)
Please return in 12 months for your annual complete physical; please come fasting.  I will release your lab results to you on your MyChart account with further instructions. Please reply with any questions.   Check with your insurance company to see if Shingrix vaccines are covered: if so, call to schedule a nurse visit to get the 1st vaccine scheduled.   If you have any questions or concerns, please don't hesitate to send me a message via MyChart or call the office at [phone]. Thank you for visiting with [place] today! It's our pleasure caring for you.   Preventive Care 42-33 Years Old, Female Preventive care refers to lifestyle choices and visits with your health care provider that can promote health and wellness. This includes:  A yearly physical exam. This is also called an annual wellness visit.  Regular dental and eye exams.  Immunizations.  Screening for certain conditions.  Healthy lifestyle choices, such as: ? Eating a healthy diet. ? Getting regular exercise. ? Not using drugs or products that contain nicotine and tobacco. ? Limiting alcohol use. What can I expect for my preventive care visit? Physical exam Your health care provider will check your:  Height and weight. These may be used to calculate your BMI (body mass index). BMI is a measurement that tells if you are at a healthy weight.  Heart rate and blood pressure.  Body temperature.  Skin for abnormal spots. Counseling Your health care provider may ask you questions about your:  Past medical problems.  Family's medical history.  Alcohol, tobacco, and drug use.  Emotional well-being.  Home life and relationship well-being.  Sexual activity.  Diet, exercise, and sleep habits.  Work and work Statistician.  Access to firearms.  Method of birth control.  Menstrual cycle.  Pregnancy history. What immunizations do I need? Vaccines are usually given at various ages, according to a schedule. Your  health care provider will recommend vaccines for you based on your age, medical history, and lifestyle or other factors, such as travel or where you work.   What tests do I need? Blood tests  Lipid and cholesterol levels. These may be checked every 5 years, or more often if you are over 78 years old.  Hepatitis C test.  Hepatitis B test. Screening  Lung cancer screening. You may have this screening every year starting at age 76 if you have a 30-pack-year history of smoking and currently smoke or have quit within the past 15 years.  Colorectal cancer screening. ? All adults should have this screening starting at age 36 and continuing until age 60. ? Your health care provider may recommend screening at age 62 if you are at increased risk. ? You will have tests every 1-10 years, depending on your results and the type of screening test.  Diabetes screening. ? This is done by checking your blood sugar (glucose) after you have not eaten for a while (fasting). ? You may have this done every 1-3 years.  Mammogram. ? This may be done every 1-2 years. ? Talk with your health care provider about when you should start having regular mammograms. This may depend on whether you have a family history of breast cancer.  BRCA-related cancer screening. This may be done if you have a family history of breast, ovarian, tubal, or peritoneal cancers.  Pelvic exam and Pap test. ? This may be done every 3 years starting at age 39. ? Starting at age 65, this may be done every 5  years if you have a Pap test in combination with an HPV test. Other tests  STD (sexually transmitted disease) testing, if you are at risk.  Bone density scan. This is done to screen for osteoporosis. You may have this scan if you are at high risk for osteoporosis. Talk with your health care provider about your test results, treatment options, and if necessary, the need for more tests. Follow these instructions at home: Eating and  drinking  Eat a diet that includes fresh fruits and vegetables, whole grains, lean protein, and low-fat dairy products.  Take vitamin and mineral supplements as recommended by your health care provider.  Do not drink alcohol if: ? Your health care provider tells you not to drink. ? You are pregnant, may be pregnant, or are planning to become pregnant.  If you drink alcohol: ? Limit how much you have to 0-1 drink a day. ? Be aware of how much alcohol is in your drink. In the [place] one drink equals one 12 oz bottle of beer (355 mL), one 5 oz glass of wine (148 mL), or one 1 oz glass of hard liquor (44 mL).   Lifestyle  Take daily care of your teeth and gums. Brush your teeth every morning and night with fluoride toothpaste. Floss one time each day.  Stay active. Exercise for at least 30 minutes 5 or more days each week.  Do not use any products that contain nicotine or tobacco, such as cigarettes, e-cigarettes, and chewing tobacco. If you need help quitting, ask your health care provider.  Do not use drugs.  If you are sexually active, practice safe sex. Use a condom or other form of protection to prevent STIs (sexually transmitted infections).  If you do not wish to become pregnant, use a form of birth control. If you plan to become pregnant, see your health care provider for a prepregnancy visit.  If told by your health care provider, take low-dose aspirin daily starting at age 24.  Find healthy ways to cope with stress, such as: ? Meditation, yoga, or listening to music. ? Journaling. ? Talking to a trusted person. ? Spending time with friends and family. Safety  Always wear your seat belt while driving or riding in a vehicle.  Do not drive: ? If you have been drinking alcohol. Do not ride with someone who has been drinking. ? When you are tired or distracted. ? While texting.  Wear a helmet and other protective equipment during sports activities.  If you have firearms  in your house, make sure you follow all gun safety procedures. What's next?  Visit your health care provider once a year for an annual wellness visit.  Ask your health care provider how often you should have your eyes and teeth checked.  Stay up to date on all vaccines. This information is not intended to replace advice given to you by your health care provider. Make sure you discuss any questions you have with your health care provider. Document Revised: 02/16/2020 Document Reviewed: 01/23/2018 Elsevier Patient Education  2021 [employer].

## 2020-07-05 LAB — HEPATITIS C ANTIBODY
Hepatitis C Ab: NONREACTIVE
SIGNAL TO CUT-OFF: 0.01 (ref <1.00)

## 2020-07-22 ENCOUNTER — Encounter: Payer: Self-pay | Admitting: Family Medicine

## 2020-09-08 ENCOUNTER — Ambulatory Visit: Payer: BC Managed Care – PPO | Admitting: Physician Assistant

## 2020-09-08 ENCOUNTER — Encounter: Payer: Self-pay | Admitting: Physician Assistant

## 2020-09-08 ENCOUNTER — Other Ambulatory Visit: Payer: Self-pay

## 2020-09-08 VITALS — BP 135/82 | HR 67 | Temp 98.3°F | Ht 65.0 in | Wt 193.2 lb

## 2020-09-08 DIAGNOSIS — L304 Erythema intertrigo: Secondary | ICD-10-CM

## 2020-09-08 DIAGNOSIS — H6122 Impacted cerumen, left ear: Secondary | ICD-10-CM

## 2020-09-08 MED ORDER — CLOTRIMAZOLE 1 % EX CREA: 1.0000 "application " | TOPICAL_CREAM | Freq: Two times a day (BID) | CUTANEOUS | 0 refills | Status: AC

## 2020-09-08 NOTE — Progress Notes (Signed)
Acute Office Visit  Subjective:    Patient ID: Catherine Haley, female    DOB: [birth year], 55 y.o.   MRN: [id number]  Chief Complaint  Patient presents with  . Rash    HPI Patient is in today for rash in both armpits for several months. Pt states she was treated with antibiotics when she had pustules present, which did help this to clear up. The irritated redness has persisted though. Denies much itching and denies any pain. She has tried steroid cream with minimal relief.   Also would like her L ear looked at - says she has a lot of wax and has to use ear drops often.    Past Medical History:  Diagnosis Date  . Hypertriglyceridemia 02/09/2019   Mild; rec fish oil bid 01/2019  . Obesity (BMI 30-39.9) 01/28/2018    Past Surgical History:  Procedure Laterality Date  . MANDIBLE FRACTURE SURGERY      Family History  Problem Relation Age of Onset  . Heart disease Mother 27  . Diverticulitis Mother   . Breast cancer Mother        postmenopausal  . Hyperlipidemia Father   . Hypertension Father   . Arthritis Father   . Colon cancer Father 69  . Heart disease Father 40       CABG  . Heart disease Maternal Grandmother   . Heart disease Maternal Grandfather   . Heart disease Paternal Grandmother   . Alcohol abuse Paternal Grandmother   . Heart disease Paternal Grandfather   . Hypertension Brother     Social History   Socioeconomic History  . Marital status: Married    Spouse name: Not on file  . Number of children: 0  . Years of education: Not on file  . Highest education level: Not on file  Occupational History  . Occupation: Doctor, hospital,     Employer: [employer]  Tobacco Use  . Smoking status: Never Smoker  . Smokeless tobacco: Never Used  Vaping Use  . Vaping Use: Never used  Substance and Sexual Activity  . Alcohol use: No  . Drug use: No  . Sexual activity: Yes    Birth control/protection: None  Other Topics Concern  . Not on file  Social History Narrative  .  Not on file   Social Determinants of Health   Financial Resource Strain: Not on file  Food Insecurity: Not on file  Transportation Needs: Not on file  Physical Activity: Not on file  Stress: Not on file  Social Connections: Not on file  Intimate Partner Violence: Not on file    Outpatient Medications Prior to Visit  Medication Sig Dispense Refill  . Ascorbic Acid (VITAMIN C PO) Take by mouth.    . Clobetasol Propionate 0.05 % lotion SMARTSIG:Sparingly Topical Twice Daily    . Coenzyme Q10 (CO Q 10 PO) Take by mouth.    . Multiple Vitamin (MULTIVITAMIN WITH MINERALS) TABS tablet Take 1 tablet by mouth daily.    . Omega-3 Fatty Acids (FISH OIL PO) Take by mouth.    Marland Kitchen VITAMIN D PO Take by mouth.     No facility-administered medications prior to visit.    No Known Allergies  Review of Systems See HPI, otherwise all other systems negative.    Objective:    Physical Exam Constitutional:      Appearance: Normal appearance.  HENT:     Left Ear: Tympanic membrane normal. There is impacted cerumen.  Skin:  Comments: Small patch of macular erythema present bilateral axilla. Some skin tags noted. No pustules. No tenderness. No excoriation.   Neurological:     Mental Status: She is alert.     BP 135/82   Pulse 67   Temp 98.3 F (36.8 C)   Ht 5\' 5"  (1.651 m)   Wt 193 lb 3.2 oz (87.6 kg)   SpO2 95%   BMI 32.15 kg/m  Wt Readings from Last 3 Encounters:  09/08/20 193 lb 3.2 oz (87.6 kg)  07/04/20 191 lb 6.4 oz (86.8 kg)  02/05/19 188 lb 12.8 oz (85.6 kg)       Assessment & Plan:   Problem List Items Addressed This Visit   None   Visit Diagnoses    Intertrigo    -  Primary   Left ear impacted cerumen           Meds ordered this encounter  Medications  . clotrimazole (LOTRIMIN) 1 % cream    Sig: Apply 1 application topically 2 (two) times daily.    Dispense:  45 g    Refill:  0   1. Intertrigo Bilateral axilla. She will use Lotrimin as directed. See AVS  for further instructions provided to patient.  2. Left ear impacted cerumen Left ear irrigation performed in office today with patient's verbal permission. She tolerated procedure well. Large amount of cerumen removed. TM intact without any signs of infection. Recommended continued Debrox drops use in the future to prevent build-up.   Raenell Mensing M Ardyth Kelso, PA-C

## 2020-09-08 NOTE — Patient Instructions (Signed)
Rash under arms looks like intertrigo, which is caused by yeast. Please use the clotrimazole as directed and let us know if this still does not improve. Change your razor often!  Try different deodorant & make sure antiperspirant is in it as well .  Keep areas dry as best as possible.   Irrigated left ear canal today and everything looks great.

## 2021-02-25 DIAGNOSIS — K219 Gastro-esophageal reflux disease without esophagitis: Secondary | ICD-10-CM

## 2021-02-25 HISTORY — DX: Gastro-esophageal reflux disease without esophagitis: K21.9

## 2021-02-27 ENCOUNTER — Encounter: Payer: Self-pay | Admitting: Family Medicine

## 2021-02-27 ENCOUNTER — Ambulatory Visit: Payer: BC Managed Care – PPO | Admitting: Family Medicine

## 2021-02-27 ENCOUNTER — Other Ambulatory Visit: Payer: Self-pay | Admitting: Family Medicine

## 2021-02-27 ENCOUNTER — Other Ambulatory Visit: Payer: Self-pay

## 2021-02-27 VITALS — BP 118/76 | HR 73 | Temp 98.4°F | Ht 65.0 in | Wt 187.0 lb

## 2021-02-27 DIAGNOSIS — E669 Obesity, unspecified: Secondary | ICD-10-CM

## 2021-02-27 DIAGNOSIS — E781 Pure hyperglyceridemia: Secondary | ICD-10-CM | POA: Diagnosis not present

## 2021-02-27 DIAGNOSIS — K219 Gastro-esophageal reflux disease without esophagitis: Secondary | ICD-10-CM

## 2021-02-27 DIAGNOSIS — R7989 Other specified abnormal findings of blood chemistry: Secondary | ICD-10-CM

## 2021-02-27 MED ORDER — FAMOTIDINE 20 MG PO TABS: 20.0000 mg | ORAL_TABLET | Freq: Two times a day (BID) | ORAL | 0 refills | Status: DC

## 2021-02-27 NOTE — Patient Instructions (Signed)
Please follow up as scheduled for your next visit with me: 07/05/2021   I will release your lab results to you on your MyChart account with further instructions. Please reply with any questions.    We will call you for an appointment for an ultrasound of your liver.  Start taking pepcid twice a day to see if we can resolve your reflux symptoms.   If you have any questions or concerns, please don't hesitate to send me a message via MyChart or call the office at [phone]. Thank you for visiting with [place] today! It's our pleasure caring for you.   Liver Function Tests Why am I having this test? Liver function tests are done to see how well your liver is working. The proteins and enzymes measured in the tests can alert your health care provider to inflammation, damage, or disease in your liver. It is common to have liver function tests: When you are taking certain medicines. If you have liver disease. If you drink a lot of alcohol. During annual physical exams. When you have other conditions that may affect your liver. If you have symptoms such as yellowing of the skin (jaundice), abdominal pain, or nausea and vomiting. What is being tested? These tests measure various substances in your blood. This may include: Alanine aminotransferase (ALT). This is an enzyme in the liver. Aspartate aminotransferase (AST). This is an enzyme in the liver, heart, and muscles. Alkaline phosphatase (ALP). This is a protein in the liver, bile ducts, bone, and other body tissues. Total bilirubin. This is a yellow pigment in bile. Albumin. This is a protein in the liver. Prothrombin time and international normalized ratio (PT and INR). PT measures the time it takes for your blood to clot. INR is a calculation of blood clotting time based on your PT result. Total protein. This includes two proteins, albumin and globulin, found in the blood. What kind of sample is taken? A blood sample is required for this test. It  is usually collected by inserting a needle into a blood vessel. How do I prepare for this test? How you prepare will depend on which tests are being done and the reason for doing them. You may need to: Avoid eating for 4-6 hours before the test, or as told by your health care provider. Follow instructions from your health care provider about eating or drinking restrictions before the tests. Stop taking certain medicines before your blood test, as told by your health care provider. Tell a health care provider about: All medicines you are taking, including vitamins, herbs, eye drops, creams, and over-the-counter medicines. Any medical conditions you have. Whether you are pregnant or may be pregnant. How are the results reported? Your test results will be reported as values. Your health care provider will compare your results to normal ranges that were established after testing a large group of people (reference ranges). Reference ranges may vary among labs and hospitals. For the substances measured in liver function tests, common reference ranges are: ALT Infant: 10-40 international units/L. Child or adult: 4-36 international units/L at 37C or 4-36 units/L (SI units). Reference ranges may be higher for older adults. AST Newborn 26-3 days old: 35-140 units/L. Child younger than 78 years old: 15-60 units/L. 63-5 years old: 15-50 units/L. 2-89 years old: 10-50 units/L. 100-82 years old: 10-40 units/L. Adult: 0-35 units/L or 0-0.58 microkatals/L (SI units). Reference ranges may be higher for older adults. ALP Child younger than 18 years old: 85-235 units/L. 40-75 years old: 65-210  units/L. 54-17 years old: 60-300 units/L. 59-108 years old: 30-200 units/L. Adult: 30-120 units/L or 0.5-2.0 microkatals/L (SI units). Reference ranges may be higher for older adults. Total bilirubin Newborn: 1.0-12.0 mg/dL or 39.0-300 micromoles/L (SI units). Child or adult: 0.3-1.0 mg/dL or 9.2-33  micromoles/L. Albumin Premature infant: 3.0-4.2 g/dL. Newborn: 3.5-5.4 g/dL. Infant: 4.4-5.4 g/dL. Child: 4.0-5.9 g/dL. Adult: 3.5-5.0 g/dL or 00-76 g/L (SI units). PT 11.0-12.5 seconds; 85%-100%. INR 0.8-1.1. Total protein Premature infant: 4.2-7.6 g/dL. Newborn: 4.6-7.4 g/dL. Infant: 6.0-6.7 g/dL. Child: 6.2-8.0 g/dL. Adult: 6.4-8.3 g/dL or 22-63 g/L (SI units). What do the results mean? Results that are within the reference ranges are considered normal. For each substance measured, results outside the reference range can indicate various health issues. ALT Levels above the normal range may indicate liver disease. Sometimes levels also increase after burns, surgery, heart attack, muscle damage, or seizure. AST Levels above the normal range may indicate liver disease, skeletal muscle diseases, or other diseases that destroy the red blood cells or tissues of the pancreas. Levels below the normal range may indicate acute kidney disease, pregnancy, or diabetic ketoacidosis. ALP Levels above the normal range may be seen in biliary obstruction, liver diseases, bone disease, thyroid disease, lymphoma, or several other conditions. People with blood type O or B may show higher levels after a fatty meal. Levels below the normal range may indicate bone and teeth conditions, malnutrition, protein deficiency, or Wilson's disease. Total bilirubin Levels above the normal range may indicate problems with the liver, gallbladder, or bile ducts. Albumin Levels above the normal range may indicate dehydration. They may also be caused by a diet that is high in protein. Levels below the normal range may indicate kidney disease, liver disease, or malabsorption of nutrients. PT and INR Levels above the normal range mean that your blood is clotting slower than normal. This may be due to blood disorders, liver disorders, certain medicines like warfarin, or low levels of vitamin K. Total protein Levels  above the normal range may be due to infection or other diseases. Levels below the normal range may be due to an immune system disorder, bleeding, burns, kidney disorder, liver disease, trouble absorbing or getting nutrients, or other conditions that affect the intestines. Talk with your health care provider about what your results mean. Questions to ask your health care provider Ask your health care provider, or the department that is doing the test: When will my results be ready? How will I get my results? What are my treatment options? What other tests do I need? What are my next steps? Summary Liver function tests are done to see how well your liver is working. The results can alert your health care provider to inflammation, damage, or disease in your liver. You may need to avoid eating for 4-6 hours before the test or stop taking certain medicines before your blood test, as told by your health care provider. Talk with your health care provider about what your results mean. This information is not intended to replace advice given to you by your health care provider. Make sure you discuss any questions you have with your health care provider. Document Revised: 02/16/2020 Document Reviewed: 02/16/2020 Elsevier Patient Education  2022 [employer].

## 2021-02-27 NOTE — Progress Notes (Signed)
Subjective  CC:  Chief Complaint  Patient presents with   Elevated Hepatic Enzymes    Discuss elevated liver test    HPI: Catherine Haley is a 55 y.o. female who presents to the office today to address the problems listed above in the chief complaint. February: had minimal elevatede ALT; now working with [employer] for weight loss. Recent labs show mildly elevated lfts; pt also reports had RUQ pain while on a cruise in [place] in may. Not really related to eating though. More positional in nature. Now reports occ soreness in the area. Also describes reflux sxs intermittently w/o heartburn or abdominal pain. Had some nausea but she related this to some of the oral supplements she has been on. No vomiting or change in bowels. No f/c/s. Rare alcohol or tylenol use.    Lab Results  Component Value Date   ALT 43 (H) 07/04/2020   AST 24 07/04/2020   ALKPHOS 72 07/04/2020   BILITOT 0.6 07/04/2020   Recent labs from private source reviewed: august alt60s, ast 40s. Nl bili. Nl cmp.   Lab Results  Component Value Date   CHOL 183 07/04/2020   HDL 33.70 (L) 07/04/2020   LDLDIRECT 134.0 07/04/2020   TRIG 270.0 (H) 07/04/2020   CHOLHDL 5 07/04/2020    Assessment  1. Elevated liver function tests   2. Gastroesophageal reflux disease, unspecified whether esophagitis present   3. Obesity (BMI 30-39.9)   4. Hypertriglyceridemia      Plan  Elevated lfts:  ? Fatty liver or biliary tract disease. Education given. Given occ pain, check ultrasound. Low fat diet. Avoidance of liver toxins. GERD: start pepcid bid. Monitor for improvement.  Hypertrigs:low fat diet and weight loss; working with [facility] [employer].  Follow up: as scheduled for cpe  07/05/2021  Orders Placed This Encounter  Procedures   US ABDOMEN LIMITED RUQ (LIVER/GB)   Hepatic function panel   Hepatitis B core antibody, total   Hepatitis B surface antigen   Meds ordered this encounter  Medications   famotidine  (PEPCID) 20 MG tablet    Sig: Take 1 tablet (20 mg total) by mouth 2 (two) times daily.    Dispense:  60 tablet    Refill:  0      I reviewed the patients updated PMH, FH, and SocHx.    Patient Active Problem List   Diagnosis Date Noted   Hypertriglyceridemia 02/09/2019   Obesity (BMI 30-39.9) 01/28/2018   Current Meds  Medication Sig   Ascorbic Acid (VITAMIN C PO) Take by mouth.   Clobetasol Propionate 0.05 % lotion SMARTSIG:Sparingly Topical Twice Daily   Coenzyme Q10 (CO Q 10 PO) Take by mouth.   famotidine (PEPCID) 20 MG tablet Take 1 tablet (20 mg total) by mouth 2 (two) times daily.   Multiple Vitamin (MULTIVITAMIN WITH MINERALS) TABS tablet Take 1 tablet by mouth daily.   Omega-3 Fatty Acids (FISH OIL PO) Take by mouth.   VITAMIN D PO Take by mouth.    Allergies: Patient has No Known Allergies. Family History: Patient family history includes Alcohol abuse in her paternal grandmother; Arthritis in her father; Breast cancer in her mother; Colon cancer (age of onset: 32) in her father; Diverticulitis in her mother; Heart disease in her maternal grandfather, maternal grandmother, paternal grandfather, and paternal grandmother; Heart disease (age of onset: 69) in her father; Heart disease (age of onset: 71) in her mother; Hyperlipidemia in her father; Hypertension in her brother and father.  Social History:  Patient  reports that she has never smoked. She has never used smokeless tobacco. She reports that she does not drink alcohol and does not use drugs.  Review of Systems: Constitutional: Negative for fever malaise or anorexia Cardiovascular: negative for chest pain Respiratory: negative for SOB or persistent cough Gastrointestinal: negative for abdominal pain  Objective  Vitals: BP 118/76   Pulse 73   Temp 98.4 F (36.9 C) (Temporal)   Ht 5\' 5"  (1.651 m)   Wt 187 lb (84.8 kg)   SpO2 97%   BMI 31.12 kg/m  General: no acute distress , A&Ox3 HEENT: PEERL, conjunctiva  normal, neck is supple Cardiovascular:  RRR without murmur or gallop.  Respiratory:  Good breath sounds bilaterally, CTAB with normal respiratory effort Gastrointestinal: soft, flat abdomen, normal active bowel sounds, no palpable masses, no hepatosplenomegaly, no appreciated hernias Mild ruq ttp w/o rebound or guarding.  Skin:  Warm, no rashes    Commons side effects, risks, benefits, and alternatives for medications and treatment plan prescribed today were discussed, and the patient expressed understanding of the given instructions. Patient is instructed to call or message via MyChart if he/she has any questions or concerns regarding our treatment plan. No barriers to understanding were identified. We discussed Red Flag symptoms and signs in detail. Patient expressed understanding regarding what to do in case of urgent or emergency type symptoms.  Medication list was reconciled, printed and provided to the patient in AVS. Patient instructions and summary information was reviewed with the patient as documented in the AVS. This note was prepared with assistance of Dragon voice recognition software. Occasional wrong-word or sound-a-like substitutions may have occurred due to the inherent limitations of voice recognition software  This visit occurred during the SARS-CoV-2 public health emergency.  Safety protocols were in place, including screening questions prior to the visit, additional usage of staff PPE, and extensive cleaning of exam room while observing appropriate contact time as indicated for disinfecting solutions.

## 2021-02-28 LAB — HEPATITIS B CORE ANTIBODY, TOTAL: Hep B Core Total Ab: NONREACTIVE

## 2021-02-28 LAB — HEPATIC FUNCTION PANEL
ALT: 38 U/L — ABNORMAL HIGH (ref 0–35)
AST: 28 U/L (ref 0–37)
Albumin: 4.5 g/dL (ref 3.5–5.2)
Alkaline Phosphatase: 78 U/L (ref 39–117)
Bilirubin, Direct: 0.1 mg/dL (ref 0.0–0.3)
Total Bilirubin: 0.4 mg/dL (ref 0.2–1.2)
Total Protein: 6.7 g/dL (ref 6.0–8.3)

## 2021-02-28 LAB — HEPATITIS B SURFACE ANTIGEN: Hepatitis B Surface Ag: NONREACTIVE

## 2021-03-07 ENCOUNTER — Ambulatory Visit
Admission: RE | Admit: 2021-03-07 | Discharge: 2021-03-07 | Disposition: A | Discharge status: Home / Self Care | Payer: BC Managed Care – PPO | Source: Ambulatory Visit | Attending: Family Medicine | Admitting: Family Medicine

## 2021-03-07 DIAGNOSIS — R7989 Other specified abnormal findings of blood chemistry: Secondary | ICD-10-CM | POA: Diagnosis not present

## 2021-03-07 DIAGNOSIS — K760 Fatty (change of) liver, not elsewhere classified: Secondary | ICD-10-CM | POA: Diagnosis not present

## 2021-03-07 DIAGNOSIS — R1011 Right upper quadrant pain: Secondary | ICD-10-CM | POA: Diagnosis not present

## 2021-03-07 DIAGNOSIS — K8020 Calculus of gallbladder without cholecystitis without obstruction: Secondary | ICD-10-CM | POA: Diagnosis not present

## 2021-03-14 ENCOUNTER — Encounter: Payer: Self-pay | Admitting: Family Medicine

## 2021-03-14 DIAGNOSIS — K8020 Calculus of gallbladder without cholecystitis without obstruction: Secondary | ICD-10-CM | POA: Insufficient documentation

## 2021-03-14 DIAGNOSIS — K760 Fatty (change of) liver, not elsewhere classified: Secondary | ICD-10-CM | POA: Insufficient documentation

## 2021-03-14 HISTORY — DX: Calculus of gallbladder without cholecystitis without obstruction: K80.20

## 2021-03-14 HISTORY — DX: Fatty (change of) liver, not elsewhere classified: K76.0

## 2021-03-20 ENCOUNTER — Encounter: Payer: Self-pay | Admitting: Family Medicine

## 2021-03-20 ENCOUNTER — Other Ambulatory Visit: Payer: Self-pay

## 2021-03-20 ENCOUNTER — Telehealth: Payer: Self-pay

## 2021-03-20 DIAGNOSIS — K819 Cholecystitis, unspecified: Secondary | ICD-10-CM

## 2021-03-20 DIAGNOSIS — R7989 Other specified abnormal findings of blood chemistry: Secondary | ICD-10-CM

## 2021-03-20 NOTE — Telephone Encounter (Signed)
Spoke with patient regarding her liver ultrasound, gave her Dr.Andys comments patient asked to speak with Dr.Andy. Was transferred to [facility] to discuss some questions

## 2021-04-04 ENCOUNTER — Other Ambulatory Visit: Payer: Self-pay

## 2021-04-04 DIAGNOSIS — R7989 Other specified abnormal findings of blood chemistry: Secondary | ICD-10-CM

## 2021-04-04 DIAGNOSIS — K829 Disease of gallbladder, unspecified: Secondary | ICD-10-CM

## 2021-04-13 DIAGNOSIS — K8020 Calculus of gallbladder without cholecystitis without obstruction: Secondary | ICD-10-CM | POA: Diagnosis not present

## 2021-04-26 ENCOUNTER — Ambulatory Visit: Payer: Self-pay | Admitting: General Surgery

## 2021-06-23 NOTE — Progress Notes (Signed)
Surgical Instructions    Your procedure is scheduled on Wednesday February 1st.  Report to [facility] Main Entrance "A" at 6:30 A.M., then check in with the Admitting office.  Call this number if you have problems the morning of surgery:  [phone]   If you have any questions prior to your surgery date call [phone]: Open Monday-Friday 8am-4pm    Remember:  Do not eat after midnight the night before your surgery  You may drink clear liquids until 5:30 the morning of your surgery.   Clear liquids allowed are: Water, Non-Citrus Juices (without pulp), Carbonated Beverages, Clear Tea, Black Coffee ONLY (NO MILK, CREAM OR POWDERED CREAMER of any kind), and Gatorade    Take these medicines the morning of surgery with A SIP OF WATER famotidine (PEPCID) 20 MG tablet     As of today, STOP taking any Aspirin (unless otherwise instructed by your surgeon) Aleve, Naproxen, Ibuprofen, Motrin, Advil, Goody's, BC's, all herbal medications, fish oil, and all vitamins.  After your COVID test   You are not required to quarantine however you are required to wear a well-fitting mask when you are out and around people not in your household.  If your mask becomes wet or soiled, replace with a new one.  Wash your hands often with soap and water for 20 seconds or clean your hands with an alcohol-based hand sanitizer that contains at least 60% alcohol.  Do not share personal items.  Notify your provider: if you are in close contact with someone who has COVID  or if you develop a fever of 100.4 or greater, sneezing, cough, sore throat, shortness of breath or body aches.           Do not wear jewelry or makeup Do not wear lotions, powders, perfumes, or deodorant. Do not shave 48 hours prior to surgery.   Do not bring valuables to the hospital. DO Not wear nail polish, gel polish, artificial nails, or any other type of covering on natural nails (fingers and toes) If you have artificial nails or  gel coating that need to be removed by a nail salon, please have this removed prior to surgery. Artificial nails or gel coating may interfere with anesthesia's ability to adequately monitor your vital signs.             [facility] is not responsible for any belongings or valuables.  Do NOT Smoke (Tobacco/Vaping)  24 hours prior to your procedure  If you use a CPAP at night, you may bring your mask for your overnight stay.   Contacts, glasses, hearing aids, dentures or partials may not be worn into surgery, please bring cases for these belongings   For patients admitted to the hospital, discharge time will be determined by your treatment team.   Patients discharged the day of surgery will not be allowed to drive home, and someone needs to stay with them for 24 hours.  NO VISITORS WILL BE ALLOWED IN PRE-OP WHERE PATIENTS ARE PREPPED FOR SURGERY.  ONLY 1 SUPPORT PERSON MAY BE PRESENT IN THE WAITING ROOM WHILE YOU ARE IN SURGERY.  IF YOU ARE TO BE ADMITTED, ONCE YOU ARE IN YOUR ROOM YOU WILL BE ALLOWED TWO (2) VISITORS. 1 (ONE) VISITOR MAY STAY OVERNIGHT BUT MUST ARRIVE TO THE ROOM BY 8pm.  Minor children may have two parents present. Special consideration for safety and communication needs will be reviewed on a case by case basis.  Special instructions:    Oral Hygiene  is also important to reduce your risk of infection.  Remember - BRUSH YOUR TEETH THE MORNING OF SURGERY WITH YOUR REGULAR TOOTHPASTE   [facility]- Preparing For Surgery  Before surgery, you can play an important role. Because skin is not sterile, your skin needs to be as free of germs as possible. You can reduce the number of germs on your skin by washing with CHG (chlorahexidine gluconate) Soap before surgery.  CHG is an antiseptic cleaner which kills germs and bonds with the skin to continue killing germs even after washing.     Please do not use if you have an allergy to CHG or antibacterial soaps. If your skin becomes  reddened/irritated stop using the CHG.  Do not shave (including legs and underarms) for at least 48 hours prior to first CHG shower. It is OK to shave your face.  Please follow these instructions carefully.     Shower the NIGHT BEFORE SURGERY and the MORNING OF SURGERY with CHG Soap.   If you chose to wash your hair, wash your hair first as usual with your normal shampoo. After you shampoo, rinse your hair and body thoroughly to remove the shampoo.  Then [employer] and genitals (private parts) with your normal soap and rinse thoroughly to remove soap.  After that Use CHG Soap as you would any other liquid soap. You can apply CHG directly to the skin and wash gently with a scrungie or a clean washcloth.   Apply the CHG Soap to your body ONLY FROM THE NECK DOWN.  Do not use on open wounds or open sores. Avoid contact with your eyes, ears, mouth and genitals (private parts). Wash Face and genitals (private parts)  with your normal soap.   Wash thoroughly, paying special attention to the area where your surgery will be performed.  Thoroughly rinse your body with warm water from the neck down.  DO NOT shower/wash with your normal soap after using and rinsing off the CHG Soap.  Pat yourself dry with a CLEAN TOWEL.  Wear CLEAN PAJAMAS to bed the night before surgery  Place CLEAN SHEETS on your bed the night before your surgery  DO NOT SLEEP WITH PETS.   Day of Surgery:  Take a shower with CHG soap. Wear Clean/Comfortable clothing the morning of surgery Do not apply any deodorants/lotions.   Remember to brush your teeth WITH YOUR REGULAR TOOTHPASTE.   Please read over the following fact sheets that you were given.

## 2021-06-26 ENCOUNTER — Other Ambulatory Visit: Payer: Self-pay

## 2021-06-26 ENCOUNTER — Encounter (HOSPITAL_COMMUNITY): Payer: Self-pay

## 2021-06-26 ENCOUNTER — Encounter (HOSPITAL_COMMUNITY)
Admission: RE | Admit: 2021-06-26 | Discharge: 2021-06-26 | Disposition: A | Discharge status: Home / Self Care | Payer: BC Managed Care – PPO | Source: Ambulatory Visit | Attending: General Surgery | Admitting: General Surgery

## 2021-06-26 VITALS — BP 132/74 | HR 63 | Temp 98.3°F | Resp 17 | Ht 64.0 in | Wt 175.9 lb

## 2021-06-26 DIAGNOSIS — Z1231 Encounter for screening mammogram for malignant neoplasm of breast: Secondary | ICD-10-CM | POA: Diagnosis not present

## 2021-06-26 DIAGNOSIS — K219 Gastro-esophageal reflux disease without esophagitis: Secondary | ICD-10-CM | POA: Diagnosis not present

## 2021-06-26 DIAGNOSIS — K8000 Calculus of gallbladder with acute cholecystitis without obstruction: Secondary | ICD-10-CM

## 2021-06-26 DIAGNOSIS — E669 Obesity, unspecified: Secondary | ICD-10-CM | POA: Diagnosis not present

## 2021-06-26 DIAGNOSIS — Z6829 Body mass index (BMI) 29.0-29.9, adult: Secondary | ICD-10-CM | POA: Diagnosis not present

## 2021-06-26 DIAGNOSIS — Z01812 Encounter for preprocedural laboratory examination: Secondary | ICD-10-CM | POA: Insufficient documentation

## 2021-06-26 DIAGNOSIS — K8010 Calculus of gallbladder with chronic cholecystitis without obstruction: Secondary | ICD-10-CM | POA: Diagnosis not present

## 2021-06-26 LAB — CBC
HCT: 40.6 % (ref 36.0–46.0)
Hemoglobin: 13.6 g/dL (ref 12.0–15.0)
MCH: 29.4 pg (ref 26.0–34.0)
MCHC: 33.5 g/dL (ref 30.0–36.0)
MCV: 87.9 fL (ref 80.0–100.0)
Platelets: 316 10*3/uL (ref 150–400)
RBC: 4.62 MIL/uL (ref 3.87–5.11)
RDW: 13.2 % (ref 11.5–15.5)
WBC: 7.7 10*3/uL (ref 4.0–10.5)
nRBC: 0 % (ref 0.0–0.2)

## 2021-06-26 LAB — HM MAMMOGRAPHY

## 2021-06-26 NOTE — Progress Notes (Signed)
PCP - Asencion Partridge MD Cardiologist - Denies  Chest x-ray - Not indicated EKG - Not indicated Stress Test - Denies ECHO - Denies Cardiac Cath - Denies  Sleep Study - Denies  DM - Denies  ERAS Protcol - Yes    COVID TEST- Not indicated  Anesthesia review: No  Patient denies shortness of breath, fever, cough and chest pain at PAT appointment   All instructions explained to the patient, with a verbal understanding of the material. Patient agrees to go over the instructions while at home for a better understanding.  The opportunity to ask questions was provided.

## 2021-06-28 ENCOUNTER — Other Ambulatory Visit: Payer: Self-pay

## 2021-06-28 ENCOUNTER — Ambulatory Visit (HOSPITAL_COMMUNITY)
Admission: RE | Admit: 2021-06-28 | Discharge: 2021-06-28 | Disposition: A | Discharge status: Home / Self Care | Payer: BC Managed Care – PPO | Attending: General Surgery | Admitting: General Surgery

## 2021-06-28 ENCOUNTER — Encounter (HOSPITAL_COMMUNITY)
Admission: RE | Disposition: A | Discharge status: Home / Self Care | Payer: Self-pay | Source: Home / Self Care | Attending: General Surgery

## 2021-06-28 ENCOUNTER — Ambulatory Visit (HOSPITAL_COMMUNITY): Payer: BC Managed Care – PPO | Admitting: Anesthesiology

## 2021-06-28 ENCOUNTER — Ambulatory Visit (HOSPITAL_COMMUNITY): Payer: BC Managed Care – PPO

## 2021-06-28 DIAGNOSIS — Z6829 Body mass index (BMI) 29.0-29.9, adult: Secondary | ICD-10-CM | POA: Insufficient documentation

## 2021-06-28 DIAGNOSIS — E669 Obesity, unspecified: Secondary | ICD-10-CM | POA: Insufficient documentation

## 2021-06-28 DIAGNOSIS — K8010 Calculus of gallbladder with chronic cholecystitis without obstruction: Secondary | ICD-10-CM | POA: Diagnosis not present

## 2021-06-28 DIAGNOSIS — K219 Gastro-esophageal reflux disease without esophagitis: Secondary | ICD-10-CM | POA: Insufficient documentation

## 2021-06-28 DIAGNOSIS — Z419 Encounter for procedure for purposes other than remedying health state, unspecified: Secondary | ICD-10-CM

## 2021-06-28 DIAGNOSIS — K8020 Calculus of gallbladder without cholecystitis without obstruction: Secondary | ICD-10-CM | POA: Diagnosis not present

## 2021-06-28 HISTORY — PX: CHOLECYSTECTOMY: SHX55

## 2021-06-28 SURGERY — LAPAROSCOPIC CHOLECYSTECTOMY WITH INTRAOPERATIVE CHOLANGIOGRAM
Anesthesia: General

## 2021-06-28 MED ORDER — ONDANSETRON HCL 4 MG/2ML IJ SOLN
INTRAMUSCULAR | Status: AC
  Filled 2021-06-28: qty 2

## 2021-06-28 MED ORDER — DEXAMETHASONE SODIUM PHOSPHATE 10 MG/ML IJ SOLN
INTRAMUSCULAR | Status: AC
  Filled 2021-06-28: qty 1

## 2021-06-28 MED ORDER — SCOPOLAMINE 1 MG/3DAYS TD PT72
1.0000 | MEDICATED_PATCH | TRANSDERMAL | Status: DC
  Administered 2021-06-28: 1.5 mg via TRANSDERMAL
  Filled 2021-06-28: qty 1

## 2021-06-28 MED ORDER — PROPOFOL 10 MG/ML IV BOLUS
INTRAVENOUS | Status: AC
  Filled 2021-06-28: qty 20

## 2021-06-28 MED ORDER — LACTATED RINGERS IV SOLN: INTRAVENOUS | Status: DC

## 2021-06-28 MED ORDER — SODIUM CHLORIDE 0.9 % IR SOLN
Status: DC | PRN
  Administered 2021-06-28: 1000 mL

## 2021-06-28 MED ORDER — OXYCODONE HCL 5 MG PO TABS: 5.0000 mg | ORAL_TABLET | Freq: Once | ORAL | Status: DC | PRN

## 2021-06-28 MED ORDER — CEFAZOLIN SODIUM-DEXTROSE 2-4 GM/100ML-% IV SOLN
2.0000 g | INTRAVENOUS | Status: AC
  Administered 2021-06-28: 2 g via INTRAVENOUS
  Filled 2021-06-28: qty 100

## 2021-06-28 MED ORDER — ROCURONIUM BROMIDE 10 MG/ML (PF) SYRINGE
PREFILLED_SYRINGE | INTRAVENOUS | Status: AC
  Filled 2021-06-28: qty 10

## 2021-06-28 MED ORDER — GABAPENTIN 300 MG PO CAPS
300.0000 mg | ORAL_CAPSULE | ORAL | Status: AC
  Administered 2021-06-28: 300 mg via ORAL
  Filled 2021-06-28: qty 1

## 2021-06-28 MED ORDER — FENTANYL CITRATE (PF) 250 MCG/5ML IJ SOLN
INTRAMUSCULAR | Status: AC
  Filled 2021-06-28: qty 5

## 2021-06-28 MED ORDER — SODIUM CHLORIDE 0.9 % IV SOLN
INTRAVENOUS | Status: DC | PRN
  Administered 2021-06-28: 200 mL

## 2021-06-28 MED ORDER — FENTANYL CITRATE (PF) 250 MCG/5ML IJ SOLN
INTRAMUSCULAR | Status: DC | PRN
  Administered 2021-06-28: 50 ug via INTRAVENOUS
  Administered 2021-06-28: 150 ug via INTRAVENOUS

## 2021-06-28 MED ORDER — LIDOCAINE 2% (20 MG/ML) 5 ML SYRINGE
INTRAMUSCULAR | Status: AC
  Filled 2021-06-28: qty 5

## 2021-06-28 MED ORDER — CHLORHEXIDINE GLUCONATE 0.12 % MT SOLN
15.0000 mL | Freq: Once | OROMUCOSAL | Status: AC
  Administered 2021-06-28: 15 mL via OROMUCOSAL
  Filled 2021-06-28: qty 15

## 2021-06-28 MED ORDER — CELECOXIB 200 MG PO CAPS
200.0000 mg | ORAL_CAPSULE | ORAL | Status: AC
  Administered 2021-06-28: 200 mg via ORAL
  Filled 2021-06-28: qty 1

## 2021-06-28 MED ORDER — SUGAMMADEX SODIUM 200 MG/2ML IV SOLN
INTRAVENOUS | Status: DC | PRN
Start: 2021-06-28 — End: 2021-06-28
  Administered 2021-06-28: 200 mg via INTRAVENOUS

## 2021-06-28 MED ORDER — ONDANSETRON HCL 4 MG/2ML IJ SOLN
INTRAMUSCULAR | Status: DC | PRN
  Administered 2021-06-28: 4 mg via INTRAVENOUS

## 2021-06-28 MED ORDER — HYDROCODONE-ACETAMINOPHEN 5-325 MG PO TABS: 1.0000 | ORAL_TABLET | Freq: Four times a day (QID) | ORAL | 0 refills | Status: DC | PRN

## 2021-06-28 MED ORDER — BUPIVACAINE-EPINEPHRINE 0.25% -1:200000 IJ SOLN
INTRAMUSCULAR | Status: DC | PRN
  Administered 2021-06-28: 17 mL

## 2021-06-28 MED ORDER — DEXAMETHASONE SODIUM PHOSPHATE 10 MG/ML IJ SOLN
INTRAMUSCULAR | Status: DC | PRN
Start: 2021-06-28 — End: 2021-06-28
  Administered 2021-06-28: 8 mg via INTRAVENOUS

## 2021-06-28 MED ORDER — PROPOFOL 10 MG/ML IV BOLUS
INTRAVENOUS | Status: DC | PRN
Start: 2021-06-28 — End: 2021-06-28
  Administered 2021-06-28: 150 mg via INTRAVENOUS

## 2021-06-28 MED ORDER — MIDAZOLAM HCL 2 MG/2ML IJ SOLN
INTRAMUSCULAR | Status: DC | PRN
Start: 2021-06-28 — End: 2021-06-28
  Administered 2021-06-28: 2 mg via INTRAVENOUS

## 2021-06-28 MED ORDER — LIDOCAINE 2% (20 MG/ML) 5 ML SYRINGE
INTRAMUSCULAR | Status: DC | PRN
  Administered 2021-06-28: 60 mg via INTRAVENOUS

## 2021-06-28 MED ORDER — CEFAZOLIN SODIUM 1 G IJ SOLR
INTRAMUSCULAR | Status: AC
  Filled 2021-06-28: qty 20

## 2021-06-28 MED ORDER — ROCURONIUM BROMIDE 10 MG/ML (PF) SYRINGE
PREFILLED_SYRINGE | INTRAVENOUS | Status: DC | PRN
Start: 2021-06-28 — End: 2021-06-28
  Administered 2021-06-28: 20 mg via INTRAVENOUS
  Administered 2021-06-28: 50 mg via INTRAVENOUS

## 2021-06-28 MED ORDER — CHLORHEXIDINE GLUCONATE CLOTH 2 % EX PADS: 6.0000 | MEDICATED_PAD | Freq: Once | CUTANEOUS | Status: DC

## 2021-06-28 MED ORDER — MIDAZOLAM HCL 2 MG/2ML IJ SOLN
INTRAMUSCULAR | Status: AC
  Filled 2021-06-28: qty 2

## 2021-06-28 MED ORDER — 0.9 % SODIUM CHLORIDE (POUR BTL) OPTIME
TOPICAL | Status: DC | PRN
  Administered 2021-06-28: 1000 mL

## 2021-06-28 MED ORDER — OXYCODONE HCL 5 MG/5ML PO SOLN: 5.0000 mg | Freq: Once | ORAL | Status: DC | PRN

## 2021-06-28 MED ORDER — ORAL CARE MOUTH RINSE: 15.0000 mL | Freq: Once | OROMUCOSAL | Status: AC

## 2021-06-28 MED ORDER — ARTIFICIAL TEARS OPHTHALMIC OINT
TOPICAL_OINTMENT | OPHTHALMIC | Status: DC | PRN
Start: 2021-06-28 — End: 2021-06-28
  Administered 2021-06-28: 1 via OPHTHALMIC

## 2021-06-28 MED ORDER — BUPIVACAINE-EPINEPHRINE (PF) 0.25% -1:200000 IJ SOLN
INTRAMUSCULAR | Status: AC
  Filled 2021-06-28: qty 30

## 2021-06-28 MED ORDER — FENTANYL CITRATE (PF) 100 MCG/2ML IJ SOLN: 25.0000 ug | INTRAMUSCULAR | Status: DC | PRN

## 2021-06-28 MED ORDER — ACETAMINOPHEN 500 MG PO TABS
1000.0000 mg | ORAL_TABLET | ORAL | Status: AC
  Administered 2021-06-28: 1000 mg via ORAL
  Filled 2021-06-28: qty 2

## 2021-06-28 MED ORDER — ONDANSETRON HCL 4 MG/2ML IJ SOLN: 4.0000 mg | Freq: Four times a day (QID) | INTRAMUSCULAR | Status: DC | PRN

## 2021-06-28 SURGICAL SUPPLY — 34 items
APPLIER CLIP 5 13 M/L LIGAMAX5 (MISCELLANEOUS) ×2
BAG COUNTER SPONGE SURGICOUNT (BAG) ×2 IMPLANT
CANISTER SUCT 3000ML PPV (MISCELLANEOUS) ×2 IMPLANT
CATH REDDICK CHOLANGI 4FR 50CM (CATHETERS) ×2 IMPLANT
CHLORAPREP W/TINT 26 (MISCELLANEOUS) ×2 IMPLANT
CLIP APPLIE 5 13 M/L LIGAMAX5 (MISCELLANEOUS) ×1 IMPLANT
COVER MAYO STAND STRL (DRAPES) ×2 IMPLANT
COVER SURGICAL LIGHT HANDLE (MISCELLANEOUS) ×2 IMPLANT
DERMABOND ADVANCED (GAUZE/BANDAGES/DRESSINGS) ×1
DERMABOND ADVANCED .7 DNX12 (GAUZE/BANDAGES/DRESSINGS) ×1 IMPLANT
DRAPE C-ARM 42X120 X-RAY (DRAPES) ×2 IMPLANT
ELECT REM PT RETURN 9FT ADLT (ELECTROSURGICAL) ×2
ELECTRODE REM PT RTRN 9FT ADLT (ELECTROSURGICAL) ×1 IMPLANT
GLOVE SURG ENC MOIS LTX SZ7.5 (GLOVE) ×2 IMPLANT
GOWN STRL REUS W/ TWL LRG LVL3 (GOWN DISPOSABLE) ×3 IMPLANT
GOWN STRL REUS W/TWL LRG LVL3 (GOWN DISPOSABLE) ×3
IV CATH 14GX2 1/4 (CATHETERS) ×2 IMPLANT
KIT BASIN OR (CUSTOM PROCEDURE TRAY) ×2 IMPLANT
KIT TURNOVER KIT B (KITS) ×2 IMPLANT
NS IRRIG 1000ML POUR BTL (IV SOLUTION) ×2 IMPLANT
PAD ARMBOARD 7.5X6 YLW CONV (MISCELLANEOUS) ×2 IMPLANT
POUCH SPECIMEN RETRIEVAL 10MM (ENDOMECHANICALS) ×2 IMPLANT
SCISSORS LAP 5X35 DISP (ENDOMECHANICALS) ×2 IMPLANT
SET IRRIG TUBING LAPAROSCOPIC (IRRIGATION / IRRIGATOR) ×2 IMPLANT
SET TUBE SMOKE EVAC HIGH FLOW (TUBING) ×2 IMPLANT
SLEEVE ENDOPATH XCEL 5M (ENDOMECHANICALS) ×4 IMPLANT
SPECIMEN JAR SMALL (MISCELLANEOUS) ×2 IMPLANT
SUT MNCRL AB 4-0 PS2 18 (SUTURE) ×2 IMPLANT
TOWEL GREEN STERILE (TOWEL DISPOSABLE) ×2 IMPLANT
TOWEL GREEN STERILE FF (TOWEL DISPOSABLE) ×2 IMPLANT
TRAY LAPAROSCOPIC MC (CUSTOM PROCEDURE TRAY) ×2 IMPLANT
TROCAR XCEL BLUNT TIP 100MML (ENDOMECHANICALS) ×2 IMPLANT
TROCAR XCEL NON-BLD 5MMX100MML (ENDOMECHANICALS) ×2 IMPLANT
WATER STERILE IRR 1000ML POUR (IV SOLUTION) ×2 IMPLANT

## 2021-06-28 NOTE — Op Note (Signed)
06/28/2021  9:40 AM  PATIENT:  Catherine Haley  56 y.o. female  PRE-OPERATIVE DIAGNOSIS:  GALLSTONES  POST-OPERATIVE DIAGNOSIS:  GALLSTONES  PROCEDURE:  Procedure(s): LAPAROSCOPIC CHOLECYSTECTOMY WITH INTRAOPERATIVE CHOLANGIOGRAM (N/A)  SURGEON:  Surgeon(s) and Role:    * Jovita Kussmaul, MD - Primary  PHYSICIAN ASSISTANT:   ASSISTANTS: Ewell Poe, RNFA   ANESTHESIA:   local and general  EBL:  10 mL   BLOOD ADMINISTERED:none  DRAINS: none   LOCAL MEDICATIONS USED:  MARCAINE     SPECIMEN:  Source of Specimen:  gallbladder  DISPOSITION OF SPECIMEN:  PATHOLOGY  COUNTS:  YES  TOURNIQUET:  * No tourniquets in log *  DICTATION: .Dragon Dictation    Procedure: After informed consent was obtained the patient was brought to the operating room and placed in the supine position on the operating room table. After adequate induction of general anesthesia the patient's abdomen was prepped with ChloraPrep allowed to dry and draped in usual sterile manner. An appropriate timeout was performed. The area below the umbilicus was infiltrated with quarter percent  Marcaine. A small incision was made with a 15 blade knife. The incision was carried down through the subcutaneous tissue bluntly with a hemostat and [employer] retractors. The linea alba was identified. The linea alba was incised with a 15 blade knife and each side was grasped with Coker clamps. The preperitoneal space was then probed with a hemostat until the peritoneum was opened and access was gained to the abdominal cavity. A 0 Vicryl pursestring stitch was placed in the fascia surrounding the opening. A Hassan cannula was then placed through the opening and anchored in place with the previously placed Vicryl purse string stitch. The abdomen was insufflated with carbon dioxide without difficulty. A laparoscope was inserted through the [facility] cannula in the right upper quadrant was inspected. Next the epigastric region was infiltrated  with % Marcaine. A small incision was made with a 15 blade knife. A 5 mm port was placed bluntly through this incision into the abdominal cavity under direct vision. Next 2 sites were chosen laterally on the right side of the abdomen for placement of 5 mm ports. Each of these areas was infiltrated with quarter percent Marcaine. Small stab incisions were made with a 15 blade knife. 5 mm ports were then placed bluntly through these incisions into the abdominal cavity under direct vision without difficulty. A blunt grasper was placed through the lateralmost 5 mm port and used to grasp the dome of the gallbladder and elevated anteriorly and superiorly. Another blunt grasper was placed through the other 5 mm port and used to retract the body and neck of the gallbladder. A dissector was placed through the epigastric port and using the electrocautery the peritoneal reflection at the gallbladder neck was opened. Blunt dissection was then carried out in this area until the gallbladder neck-cystic duct junction was readily identified and a good window was created. A single clip was placed on the gallbladder neck. A small  ductotomy was made just below the clip with laparoscopic scissors. A 14-gauge Angiocath was then placed through the anterior abdominal wall under direct vision. A Reddick cholangiogram catheter was then placed through the Angiocath and flushed. The catheter was then placed in the cystic duct and anchored in place with a clip. A cholangiogram was obtained that showed no filling defects good emptying into the duodenum an adequate length on the cystic duct. The anchoring clip and catheters were then removed from the patient. 3 clips  were placed proximally on the cystic duct and the duct was divided between the 2 sets of clips. Posterior to this the cystic artery was identified and again dissected bluntly in a circumferential manner until a good window  was created. 2 clips were placed proximally and one  distally on the artery and the artery was divided between the 2 sets of clips. Next a laparoscopic hook cautery device was used to separate the gallbladder from the liver bed. Prior to completely detaching the gallbladder from the liver bed the liver bed was inspected and several small bleeding points were coagulated with the electrocautery until the area was completely hemostatic. The gallbladder was then detached the rest of it from the liver bed without difficulty. A laparoscopic bag was inserted through the hassan port. The laparoscope was moved to the epigastric port. The gallbladder was placed within the bag and the bag was sealed.  The bag with the gallbladder was then removed with the [facility] cannula through the infraumbilical port without difficulty. The fascial defect was then closed with the previously placed Vicryl pursestring stitch as well as with another figure-of-eight 0 Vicryl stitch. The liver bed was inspected again and found to be hemostatic. The abdomen was irrigated with copious amounts of saline until the effluent was clear. The ports were then removed under direct vision without difficulty and were found to be hemostatic. The gas was allowed to escape. The skin incisions were all closed with interrupted 4-0 Monocryl subcuticular stitches. Dermabond dressings were applied. The patient tolerated the procedure well. At the end of the case all needle sponge and instrument counts were correct. The patient was then awakened and taken to recovery in stable condition   PLAN OF CARE: Discharge to home after PACU  PATIENT DISPOSITION:  PACU - hemodynamically stable.   Delay start of Pharmacological VTE agent (>24hrs) due to surgical blood loss or risk of bleeding: not applicable

## 2021-06-28 NOTE — H&P (Signed)
REFERRING PHYSICIAN: Shelva Majestic, MD  PROVIDER: Lindell Noe, MD  MRN: [id number] DOB: [birth year] Subjective  Chief Complaint: gallbladder   History of Present Illness: Catherine Haley is a 56 y.o. female who is seen today as an office consultation at the request of Dr. Durene Cal for evaluation of gallbladder .   We are asked to see the patient in consultation by Dr. Tana Conch to evaluate her for gallstones. The patient is a 56 year old white female who was on a trip to [place] in May when she developed some right upper quadrant pain with nausea. The pain resolved by the time she returned home. She went to her medical doctor where she was found to have 1 elevated liver function. As part of the work-up she underwent an ultrasound which did show gallstones but no gallbladder wall thickening or ductal dilatation. She is otherwise in good health and does not smoke.  Review of Systems: A complete review of systems was obtained from the patient. I have reviewed this information and discussed as appropriate with the patient. See HPI as well for other ROS.  ROS   Medical History: History reviewed. No pertinent past medical history.  Patient Active Problem List  Diagnosis   Calculus of gallbladder without cholecystitis without obstruction   Past Surgical History:  Procedure Laterality Date   jaw surgery    No Known Allergies  Current Outpatient Medications on File Prior to Visit  Medication Sig Dispense Refill   famotidine (PEPCID) 20 MG tablet   No current facility-administered medications on file prior to visit.   Family History  Problem Relation Age of Onset   Breast cancer Mother   High blood pressure (Hypertension) Father   Heart valve disease Father   Colon cancer Father    Social History   Tobacco Use  Smoking Status Never  Smokeless Tobacco Never    Social History   Socioeconomic History   Marital status: Married  Tobacco Use   Smoking status: Never    Smokeless tobacco: Never  Vaping Use   Vaping Use: Never used  Substance and Sexual Activity   Alcohol use: Never   Drug use: Never   Objective:   Vitals:  BP: 120/78  Pulse: 90  Temp: 37.2 C (99 F)  SpO2: 98%  Weight: 83 kg (183 lb)  Height: 162.6 cm (5\' 4" )   Body mass index is 31.41 kg/m.  Physical Exam Vitals reviewed.  Constitutional:  General: She is not in acute distress. Appearance: Normal appearance.  HENT:  Head: Normocephalic and atraumatic.  Right Ear: External ear normal.  Left Ear: External ear normal.  Nose: Nose normal.  Mouth/Throat:  Mouth: Mucous membranes are moist.  Pharynx: Oropharynx is clear.  Eyes:  General: No scleral icterus. Extraocular Movements: Extraocular movements intact.  Conjunctiva/sclera: Conjunctivae normal.  Pupils: Pupils are equal, round, and reactive to light.  Cardiovascular:  Rate and Rhythm: Normal rate and regular rhythm.  Pulses: Normal pulses.  Heart sounds: Normal heart sounds.  Pulmonary:  Effort: Pulmonary effort is normal. No respiratory distress.  Breath sounds: Normal breath sounds.  Abdominal:  General: Bowel sounds are normal.  Palpations: Abdomen is soft. There is no mass.  Tenderness: There is no abdominal tenderness.  Musculoskeletal:  General: No swelling, tenderness or deformity. Normal range of motion.  Cervical back: Normal range of motion and neck supple.  Skin: General: Skin is warm and dry.  Coloration: Skin is not jaundiced.  Neurological:  General: No focal deficit present.  Mental Status: She is alert and oriented to person, place, and time.  Psychiatric:  Mood and Affect: Mood normal.  Behavior: Behavior normal.     Labs, Imaging and Diagnostic Testing:  Assessment and Plan:  Diagnoses and all orders for this visit:  Calculus of gallbladder without cholecystitis without obstruction    The patient appears to have symptomatic gallstones. Because of the risk of further  painful episodes and possible pancreatitis I feel she would probably benefit from having her gallbladder removed. I have discussed with her in detail the risks and benefits of the operation as well as some of the technical aspects including the risk of common duct injury and she understands. She would like to think about it before making her final decision on whether she wants surgery or not.

## 2021-06-28 NOTE — Transfer of Care (Signed)
Immediate Anesthesia Transfer of Care Note  Patient: Catherine Haley  Procedure(s) Performed: LAPAROSCOPIC CHOLECYSTECTOMY WITH INTRAOPERATIVE CHOLANGIOGRAM  Patient Location: PACU  Anesthesia Type:General  Level of Consciousness: sedated  Airway & Oxygen Therapy: Patient Spontanous Breathing and Patient connected to face mask oxygen  Post-op Assessment: Report given to RN and Post -op Vital signs reviewed and stable  Post vital signs: Reviewed and stable  Last Vitals:  Vitals Value Taken Time  BP 136/72 06/28/21 0952  Temp    Pulse 82 06/28/21 0957  Resp 18 06/28/21 0957  SpO2 99 % 06/28/21 0957  Vitals shown include unvalidated device data.  Last Pain:  Vitals:   06/28/21 0747  TempSrc:   PainSc: 0-No pain         Complications: No notable events documented.

## 2021-06-28 NOTE — Anesthesia Postprocedure Evaluation (Signed)
Anesthesia Post Note  Patient: Catherine Haley  Procedure(s) Performed: LAPAROSCOPIC CHOLECYSTECTOMY WITH INTRAOPERATIVE CHOLANGIOGRAM     Patient location during evaluation: PACU Anesthesia Type: General Level of consciousness: awake Pain management: pain level controlled Vital Signs Assessment: post-procedure vital signs reviewed and stable Respiratory status: spontaneous breathing, nonlabored ventilation, respiratory function stable and patient connected to nasal cannula oxygen Cardiovascular status: blood pressure returned to baseline and stable Postop Assessment: no apparent nausea or vomiting Anesthetic complications: no   No notable events documented.  Last Vitals:  Vitals:   06/28/21 1115 06/28/21 1123  BP:  (!) 156/87  Pulse: 62 73  Resp: 15 (!) 21  Temp: 36.6 C   SpO2: 97% 95%    Last Pain:  Vitals:   06/28/21 1115  TempSrc:   PainSc: 0-No pain                 Dayonna Selbe P Ireene Ballowe

## 2021-06-28 NOTE — Interval H&P Note (Signed)
History and Physical Interval Note:  06/28/2021 8:08 AM  Catherine Haley  has presented today for surgery, with the diagnosis of GALLSTONES.  The various methods of treatment have been discussed with the patient and family. After consideration of risks, benefits and other options for treatment, the patient has consented to  Procedure(s): LAPAROSCOPIC CHOLECYSTECTOMY WITH INTRAOPERATIVE CHOLANGIOGRAM (N/A) as a surgical intervention.  The patient's history has been reviewed, patient examined, no change in status, stable for surgery.  I have reviewed the patient's chart and labs.  Questions were answered to the patient's satisfaction.     [name]

## 2021-06-28 NOTE — Anesthesia Procedure Notes (Signed)
Procedure Name: Intubation Date/Time: 06/28/2021 8:34 AM Performed by: Leonor Liv, CRNA Pre-anesthesia Checklist: Patient identified, Emergency Drugs available, Suction available and Patient being monitored Patient Re-evaluated:Patient Re-evaluated prior to induction Oxygen Delivery Method: Circle System Utilized Preoxygenation: Pre-oxygenation with 100% oxygen Induction Type: IV induction Ventilation: Mask ventilation without difficulty Laryngoscope Size: Mac and 3 Grade View: Grade I Tube type: Oral Tube size: 7.0 mm Number of attempts: 1 Airway Equipment and Method: Stylet and Oral airway Placement Confirmation: ETT inserted through vocal cords under direct vision, positive ETCO2 and breath sounds checked- equal and bilateral Secured at: 21 cm Tube secured with: Tape Dental Injury: Teeth and Oropharynx as per pre-operative assessment

## 2021-06-28 NOTE — Anesthesia Preprocedure Evaluation (Addendum)
Anesthesia Evaluation  Patient identified by MRN, date of birth, ID band Patient awake    Reviewed: Allergy & Precautions, H&P , NPO status , Patient's Chart, lab work & pertinent test results  Airway Mallampati: II   Neck ROM: full    Dental   Pulmonary neg pulmonary ROS,    breath sounds clear to auscultation       Cardiovascular negative cardio ROS   Rhythm:regular Rate:Normal     Neuro/Psych negative neurological ROS  negative psych ROS   GI/Hepatic Neg liver ROS, GERD  Medicated and Controlled,  Endo/Other  negative endocrine ROS  Renal/GU negative Renal ROS     Musculoskeletal negative musculoskeletal ROS (+)   Abdominal (+) + obese,   Peds  Hematology negative hematology ROS (+)   Anesthesia Other Findings GALLSTONES  Reproductive/Obstetrics                           Anesthesia Physical Anesthesia Plan  ASA: 2  Anesthesia Plan: General   Post-op Pain Management:    Induction: Intravenous  PONV Risk Score and Plan: Scopolamine patch - Pre-op, Midazolam, Dexamethasone, Ondansetron and Treatment may vary due to age or medical condition  Airway Management Planned: Oral ETT  Additional Equipment:   Intra-op Plan:   Post-operative Plan: Extubation in OR  Informed Consent: I have reviewed the patients History and Physical, chart, labs and discussed the procedure including the risks, benefits and alternatives for the proposed anesthesia with the patient or authorized representative who has indicated his/her understanding and acceptance.     Dental advisory given  Plan Discussed with: CRNA and Anesthesiologist  Anesthesia Plan Comments:       Anesthesia Quick Evaluation

## 2021-06-29 ENCOUNTER — Encounter: Payer: Self-pay | Admitting: Family Medicine

## 2021-06-29 ENCOUNTER — Encounter (HOSPITAL_COMMUNITY): Payer: Self-pay | Admitting: General Surgery

## 2021-06-29 LAB — SURGICAL PATHOLOGY

## 2021-07-05 ENCOUNTER — Encounter: Payer: BC Managed Care – PPO | Admitting: Family Medicine

## 2021-10-25 ENCOUNTER — Encounter (HOSPITAL_BASED_OUTPATIENT_CLINIC_OR_DEPARTMENT_OTHER): Payer: Self-pay

## 2021-11-17 ENCOUNTER — Encounter (HOSPITAL_BASED_OUTPATIENT_CLINIC_OR_DEPARTMENT_OTHER): Payer: Self-pay | Admitting: Nurse Practitioner

## 2021-11-17 ENCOUNTER — Ambulatory Visit (HOSPITAL_BASED_OUTPATIENT_CLINIC_OR_DEPARTMENT_OTHER): Payer: BC Managed Care – PPO | Admitting: Nurse Practitioner

## 2021-11-17 VITALS — BP 134/65 | HR 74 | Ht 64.0 in | Wt 173.2 lb

## 2021-11-17 DIAGNOSIS — E669 Obesity, unspecified: Secondary | ICD-10-CM | POA: Diagnosis not present

## 2021-11-17 DIAGNOSIS — E781 Pure hyperglyceridemia: Secondary | ICD-10-CM | POA: Diagnosis not present

## 2021-11-17 DIAGNOSIS — Z23 Encounter for immunization: Secondary | ICD-10-CM

## 2021-11-17 DIAGNOSIS — K760 Fatty (change of) liver, not elsewhere classified: Secondary | ICD-10-CM | POA: Diagnosis not present

## 2021-11-17 DIAGNOSIS — Z0000 Encounter for general adult medical examination without abnormal findings: Secondary | ICD-10-CM | POA: Diagnosis not present

## 2021-11-17 DIAGNOSIS — Z8719 Personal history of other diseases of the digestive system: Secondary | ICD-10-CM

## 2021-11-17 MED ORDER — ZOSTER VAC RECOMB ADJUVANTED 50 MCG/0.5ML IM SUSR: 0.5000 mL | Freq: Once | INTRAMUSCULAR | 1 refills | Status: AC

## 2021-11-17 NOTE — Progress Notes (Signed)
Catherine Eth, DNP, AGNP-c Primary Care & Sports Medicine [address]  [address] [place], [place] [address] [phone]  New patient visit   Patient: Catherine Haley   DOB: [birth year]   56 y.o. Female  MRN: [id number] Visit Date: 11/17/2021  Patient Care Team: Yasaman Kolek, Sung Amabile, NP as PCP - General (Nurse Practitioner) Mammography, [facility] (Diagnostic Radiology)  Today's Vitals   11/17/21 0921  BP: 134/65  Pulse: 74  SpO2: 98%  Weight: 173 lb 3.2 oz (78.6 kg)  Height: 5\' 4"  (1.626 m)   Body mass index is 29.73 kg/m.   Today's healthcare provider: , NP   Chief Complaint  Patient presents with   Establish Care   Subjective    Catherine Haley is a 56 y.o. female who presents today as a new patient to establish care.    Patient endorses the following concerns presently: Wellness program/weight loss -Catherine Haley is working with a weight loss program to help improve her overall health and decrease her weight.  So far she has been successful in her weight loss efforts.  She is currently working with a virtual provider and going over her stats every few months for evaluation.  She has recently had labs completed through this program and will send these to me to review.  Hepatic steatosis  -Catherine Haley tells me that last year her provider noted elevated LFTs on her lab results and sent her for an ultrasound.  Ultrasound did show the presence of fatty liver.  She is currently working on diet and exercise activities to help reduce her risks associated with this.  She has recently had labs with a wellness program and will forward these to me to review. At the same time the ultrasound also showed cholelithiasis.  In February of this year she underwent a cholecystectomy.  She has recovered well from that procedure  History reviewed and reveals the following: Past Medical History:  Diagnosis Date   Cholelithiasis 03/14/2021   03/16/2021 02/2021   GERD (gastroesophageal  reflux disease) 02/2021   Hepatic steatosis 03/14/2021   By ultrasound for elevated lfts 02/2021   Hypertriglyceridemia 02/09/2019   Mild; rec fish oil bid 01/2019   Obesity (BMI 30-39.9) 01/28/2018   Past Surgical History:  Procedure Laterality Date   CHOLECYSTECTOMY N/A 06/28/2021   Procedure: LAPAROSCOPIC CHOLECYSTECTOMY WITH INTRAOPERATIVE CHOLANGIOGRAM;  Surgeon: 08/26/2021, MD;  Location: [facility] OR;  Service: General;  Laterality: N/A;   EYE SURGERY Bilateral    lasix   MANDIBLE FRACTURE SURGERY     Family Status  Relation Name Status   Mother [facility] Alive   Father Renato Gails Alive   Brother  Alive   [employer]illyn [place] Deceased   MGF  Deceased   [facility] Deceased   PGF F[place] Deceased   Brother [place] Alive   Family History  Problem Relation Age of Onset   Heart disease Mother 26   Diverticulitis Mother    Breast cancer Mother        postmenopausal   Cancer Mother    Hyperlipidemia Father    Hypertension Father    Arthritis Father    Colon cancer Father 21   Heart disease Father 27       CABG   Cancer Father    Heart disease Maternal Grandmother    Heart disease Maternal Grandfather    Heart disease Paternal Grandmother    Alcohol abuse Paternal Grandmother  Heart disease Paternal Grandfather    Alcohol abuse Paternal Grandfather    Hypertension Brother    Social History   Socioeconomic History   Marital status: Married    Spouse name: Jillyn Hidden   Number of children: 0   Years of education: Not on file   Highest education level: Not on file  Occupational History   Occupation: Doctor, hospital,     Employer: [employer]  Tobacco Use   Smoking status: Never   Smokeless tobacco: Never  Vaping Use   Vaping Use: Never used  Substance and Sexual Activity   Alcohol use: No   Drug use: No   Sexual activity: Not Currently    Birth control/protection: None  Other Topics Concern   Not on file  Social History Narrative    Not on file   Social Determinants of Health   Financial Resource Strain: Not on file  Food Insecurity: Not on file  Transportation Needs: Not on file  Physical Activity: Not on file  Stress: Not on file  Social Connections: Not on file   Outpatient Medications Prior to Visit  Medication Sig   Ascorbic Acid (VITAMIN C PO) Take by mouth.   Multiple Vitamin (MULTIVITAMIN WITH MINERALS) TABS tablet Take 1 tablet by mouth daily.   Omega-3 Fatty Acids (FISH OIL PO) Take 1 tablet by mouth daily.   VITAMIN D PO Take by mouth.   [DISCONTINUED] famotidine (PEPCID) 20 MG tablet TAKE 1 TABLET(20 MG) BY MOUTH TWICE DAILY   [DISCONTINUED] HYDROcodone-acetaminophen (NORCO/VICODIN) 5-325 MG tablet Take 1 tablet by mouth every 6 (six) hours as needed for moderate pain or severe pain.   No facility-administered medications prior to visit.   No Known Allergies Immunization History  Administered Date(s) Administered   Moderna Sars-Covid-2 Vaccination 08/12/2019, 09/09/2019, 05/30/2020   Tdap 06/28/2017    Review of Systems All review of systems negative except what is listed in the HPI   Objective    BP 134/65   Pulse 74   Ht 5\' 4"  (1.626 m)   Wt 173 lb 3.2 oz (78.6 kg)   SpO2 98%   BMI 29.73 kg/m  Physical Exam Vitals and nursing note reviewed.  Constitutional:      General: She is not in acute distress.    Appearance: Normal appearance.  Eyes:     Extraocular Movements: Extraocular movements intact.     Conjunctiva/sclera: Conjunctivae normal.     Pupils: Pupils are equal, round, and reactive to light.  Neck:     Vascular: No carotid bruit.  Cardiovascular:     Rate and Rhythm: Normal rate and regular rhythm.     Pulses: Normal pulses.     Heart sounds: Normal heart sounds. No murmur heard. Pulmonary:     Effort: Pulmonary effort is normal.     Breath sounds: Normal breath sounds. No wheezing.  Abdominal:     General: Bowel sounds are normal.     Palpations: Abdomen is  soft.  Musculoskeletal:        General: Normal range of motion.     Cervical back: Normal range of motion.     Right lower leg: No edema.     Left lower leg: No edema.  Skin:    General: Skin is warm and dry.     Capillary Refill: Capillary refill takes less than 2 seconds.  Neurological:     General: No focal deficit present.     Mental Status: She is alert and oriented to person,  place, and time.  Psychiatric:        Mood and Affect: Mood normal.        Behavior: Behavior normal.        Thought Content: Thought content normal.        Judgment: Judgment normal.     Results for orders placed or performed in visit on 11/17/21  Lab report - scanned  Result Value Ref Range   Comprehensive Metabolic Panel     CBC and Platelet Ct     Lipids     Testosterone,Free     Hemoglobin-A1c     Thyroxine (T4)     DHEA-Sulfate, Serum     TSH     LH     FSH     Estradiol     IGF Binding Protein 1     Vitamin D, 25-Hydroxy     hs-CRP     Homocyst(e)ine, P/S     Magnesium     Progesterone     Apolipoprotein B      Assessment & Plan      Problem List Items Addressed This Visit     Obesity (BMI 30-39.9)    BMI decreased to 29.73 this time.  She has been working with a wellness program and has lost weight with these efforts.  Encouraged patient to continue with her current efforts as these are working for her. Recommend at least 150 minutes of moderate intensity aerobic activity per week.  Also recommend diet low in saturated fats carbohydrates.  Encouraged increased intake of fresh fruits and vegetables and lean meats. We will review labs and make changes to plan of care as necessary based on findings.      Hypertriglyceridemia    Reported mild elevation.  Currently taking omega-3 fatty acids daily.  She is also working with a wellness program for weight loss through a virtual provider program. Recommend continued close monitoring of diet with limited saturated fats and  carbohydrates. Recommend mild intensity aerobic exercise was 150 minutes/week. We will monitor previous labs and make changes to plan of care as necessary based on findings.      Hepatic steatosis - Primary    Diagnosis in 2022 with liver ultrasound.  No symptoms present at this time. Recommend continuation of weight program and working on a healthy and balanced diet which includes a variety of fruits, vegetables, whole grains, lean proteins, and limited saturated fats, refined sugars, and processed foods.   Recommend moderation or abstinence of alcohol consumption to reduce risk of worsening liver function. Recommend regular exercise aiming for at least 150 minutes of moderate intensity aerobic activity per week. Recommend avoiding medications that can be hard on the liver such as Tylenol. Recommend optimal control of blood pressures, blood sugars, weight, and cholesterol. We will plan to review previous labs and make further recommendations to plan of care as necessary.      History of cholelithiasis    Cholelithiasis diagnosed in October 2022.  Patient subsequently underwent a cholecystectomy in February this year.  Reportedly doing well with no concerns at this time.      Other Visit Diagnoses     Healthcare maintenance       Need for shingles vaccine          Return for when due for CPE.    Gael Londo, Sung Amabile, NP, DNP, AGNP-C Primary Care & Sports Medicine at [facility]

## 2021-11-21 ENCOUNTER — Encounter (HOSPITAL_BASED_OUTPATIENT_CLINIC_OR_DEPARTMENT_OTHER): Payer: Self-pay | Admitting: Nurse Practitioner

## 2021-11-21 LAB — LAB REPORT - SCANNED

## 2021-11-29 DIAGNOSIS — Z8719 Personal history of other diseases of the digestive system: Secondary | ICD-10-CM

## 2021-11-29 DIAGNOSIS — Z9049 Acquired absence of other specified parts of digestive tract: Secondary | ICD-10-CM | POA: Insufficient documentation

## 2021-11-29 HISTORY — DX: Personal history of other diseases of the digestive system: Z87.19

## 2021-11-29 NOTE — Assessment & Plan Note (Signed)
BMI decreased to 29.73 this time.  She has been working with a wellness program and has lost weight with these efforts.  Encouraged patient to continue with her current efforts as these are working for her. Recommend at least 150 minutes of moderate intensity aerobic activity per week.  Also recommend diet low in saturated fats carbohydrates.  Encouraged increased intake of fresh fruits and vegetables and lean meats. We will review labs and make changes to plan of care as necessary based on findings.

## 2021-11-29 NOTE — Assessment & Plan Note (Signed)
Diagnosis in 2022 with liver ultrasound.  No symptoms present at this time. Recommend continuation of weight program and working on a healthy and balanced diet which includes a variety of fruits, vegetables, whole grains, lean proteins, and limited saturated fats, refined sugars, and processed foods.   Recommend moderation or abstinence of alcohol consumption to reduce risk of worsening liver function. Recommend regular exercise aiming for at least 150 minutes of moderate intensity aerobic activity per week. Recommend avoiding medications that can be hard on the liver such as Tylenol. Recommend optimal control of blood pressures, blood sugars, weight, and cholesterol. We will plan to review previous labs and make further recommendations to plan of care as necessary.

## 2021-11-29 NOTE — Assessment & Plan Note (Signed)
Reported mild elevation.  Currently taking omega-3 fatty acids daily.  She is also working with a wellness program for weight loss through a virtual provider program. Recommend continued close monitoring of diet with limited saturated fats and carbohydrates. Recommend mild intensity aerobic exercise was 150 minutes/week. We will monitor previous labs and make changes to plan of care as necessary based on findings.

## 2021-11-29 NOTE — Assessment & Plan Note (Signed)
Cholelithiasis diagnosed in October 2022.  Patient subsequently underwent a cholecystectomy in February this year.  Reportedly doing well with no concerns at this time.

## 2022-01-21 IMAGING — US US ABDOMEN LIMITED
1 series · 14 of 25 positions shown · non-contrast
Comparison: None.

CLINICAL DATA: Right upper quadrant pain, elevated LFTs

EXAM:
ULTRASOUND ABDOMEN LIMITED RIGHT UPPER QUADRANT

[Series 1: us abdomen limited · 0.19mm/px · 14 of 52 slices shown]
[im 1/52]
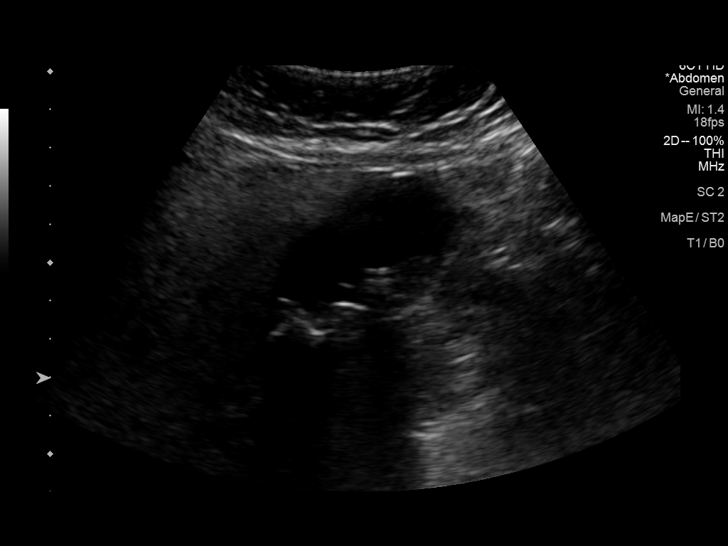
[im 5/52]
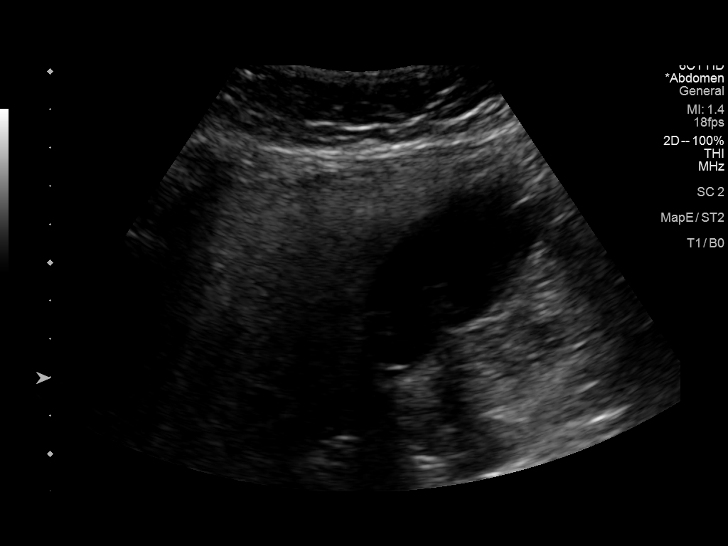
[im 9/52]
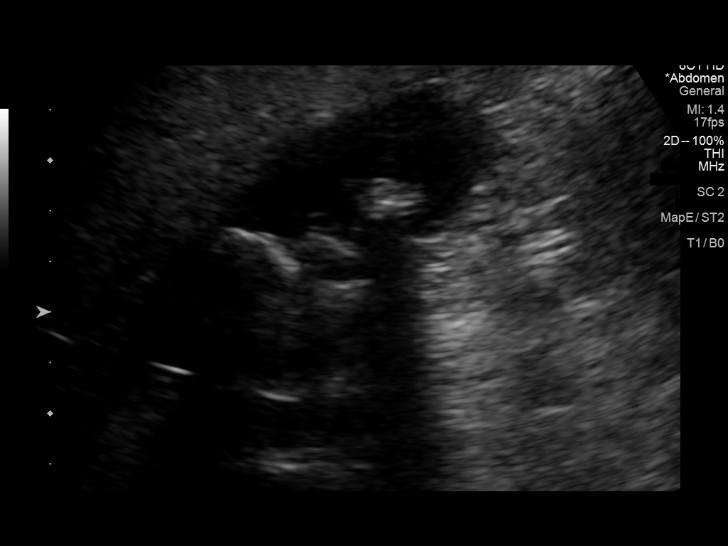
[im 13/52]
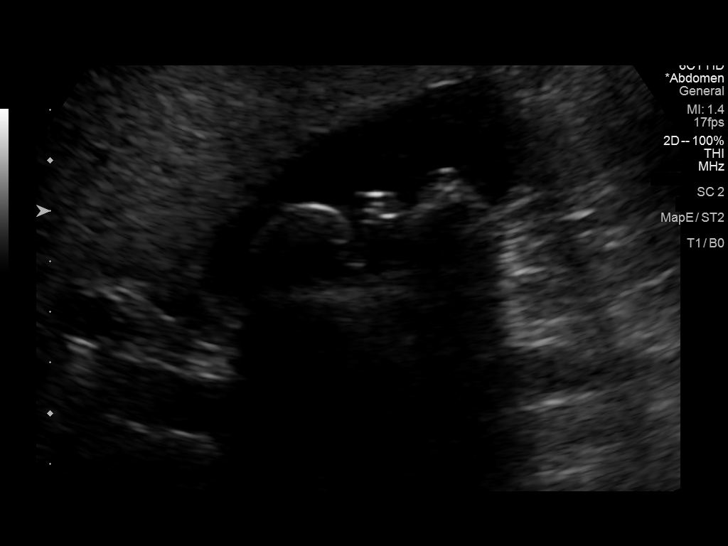
[im 18/52]
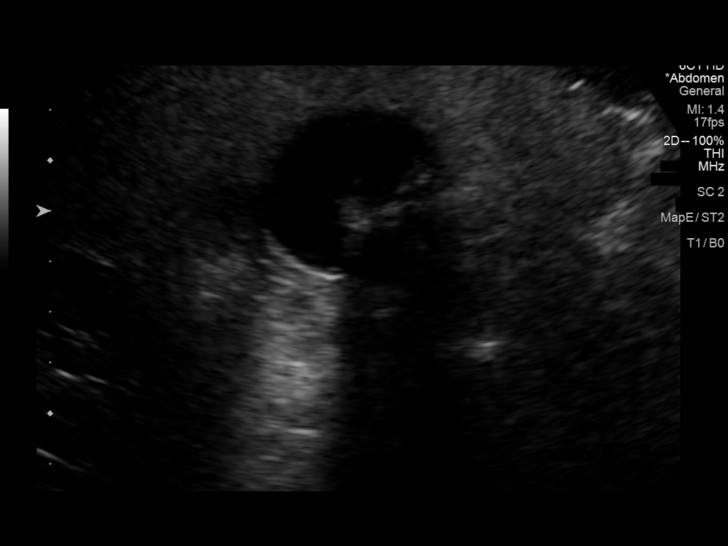
[im 20/52]
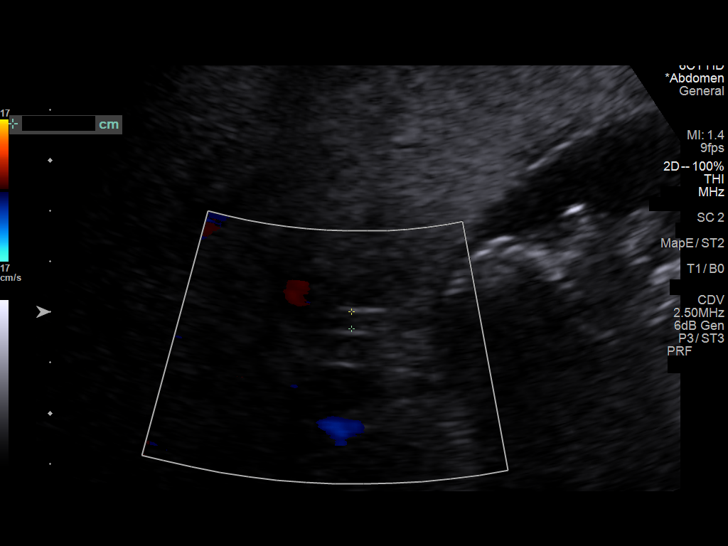
[im 24/52]
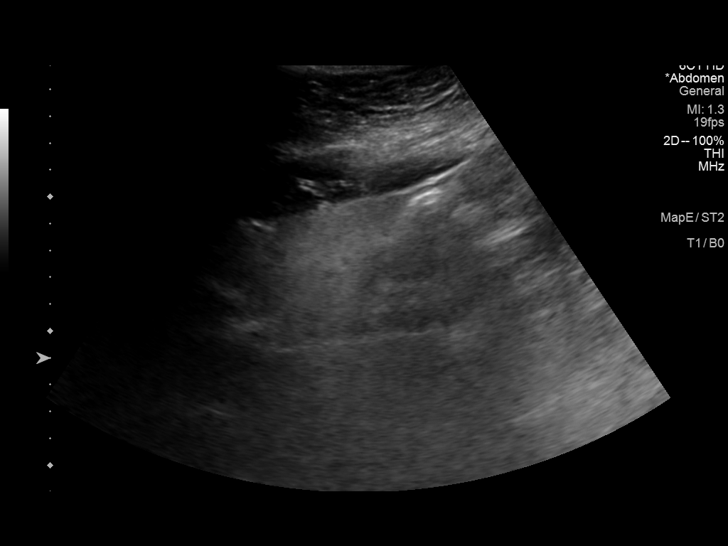
[im 28/52]
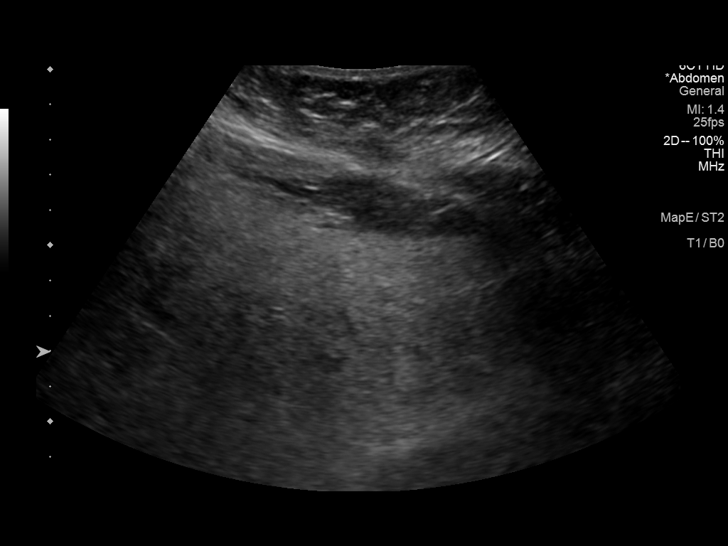
[im 32/52]
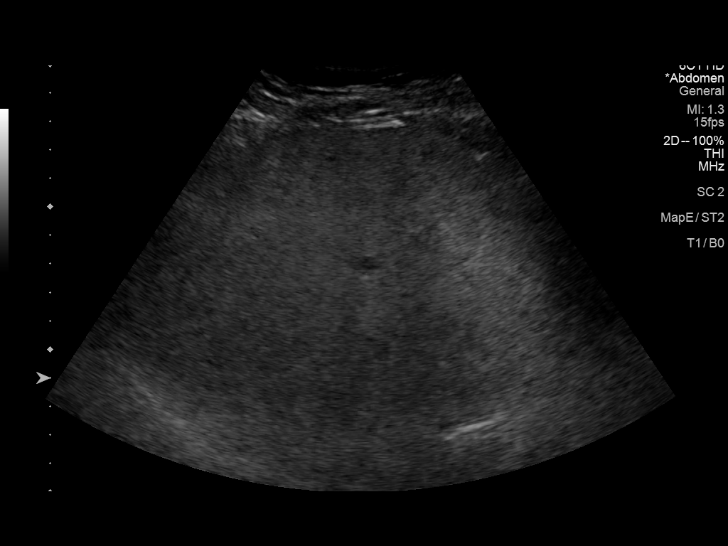
[im 35/52]
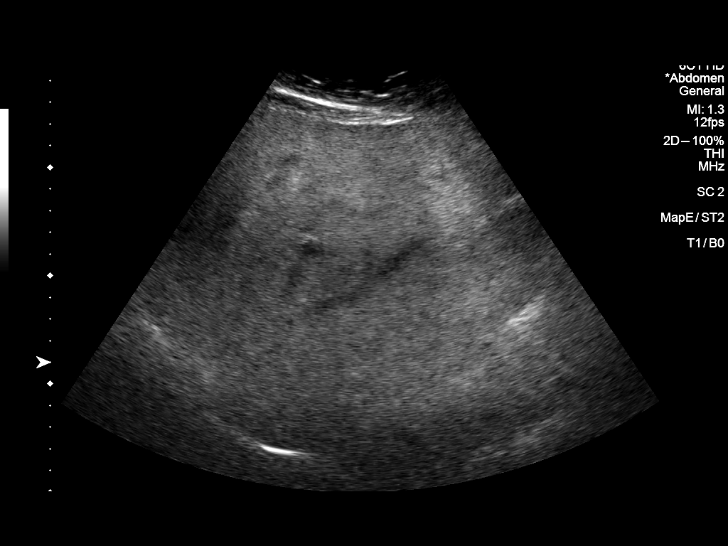
[im 39/52]
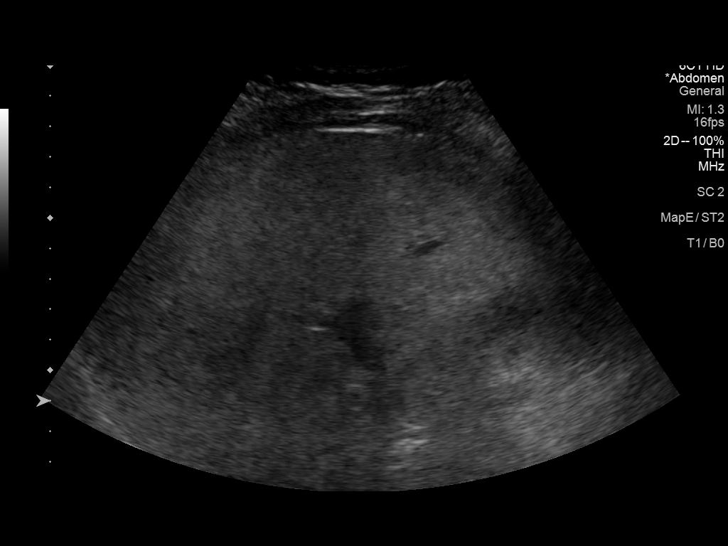
[im 43/52]
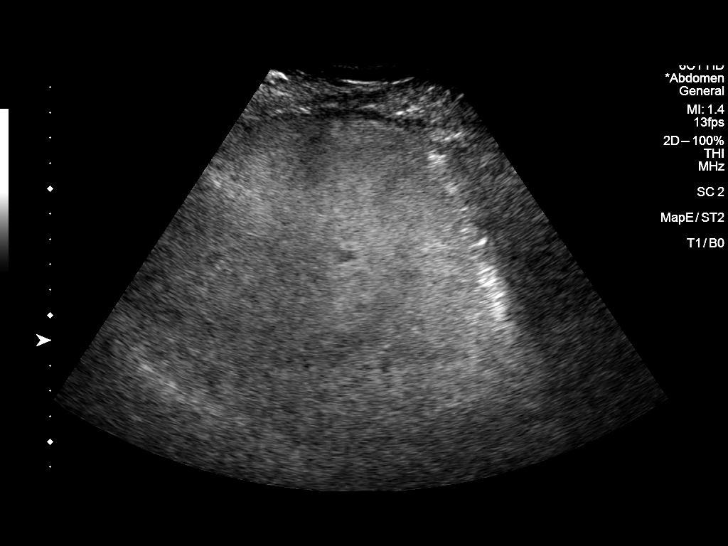
[im 47/52]
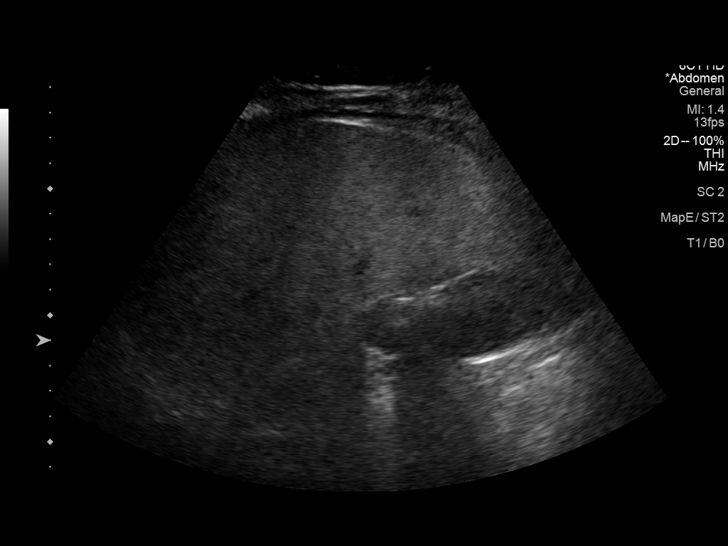
[im 52/52]
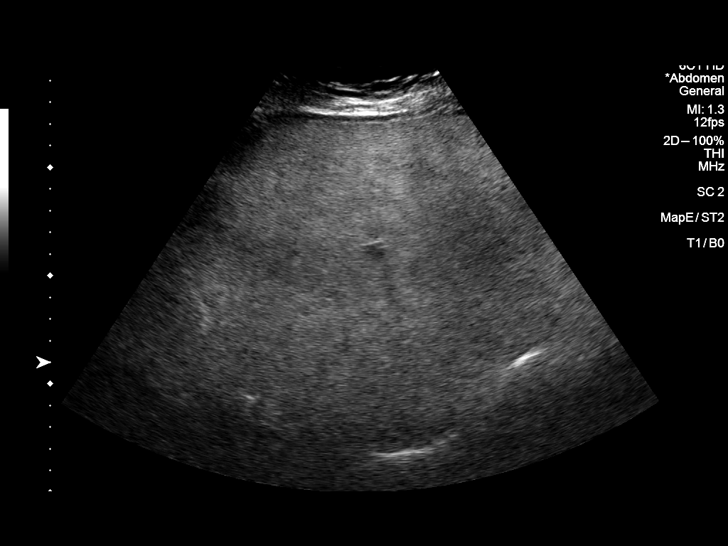

[14 of 25 positions shown; findings below may reference images not displayed]

FINDINGS: Gallbladder:

Gallstones. No wall thickening visualized. No sonographic Murphy
sign noted by sonographer.

Common bile duct:

Diameter: 3 mm

Liver:

No focal lesion identified. Increased parenchymal echogenicity.
Portal vein is patent on color Doppler imaging with normal direction
of blood flow towards the liver.

Other: None.
IMPRESSION: 1. Cholelithiasis without sonographic evidence of acute
cholecystitis.
2. Hepatic steatosis.

## 2022-01-22 ENCOUNTER — Other Ambulatory Visit (HOSPITAL_BASED_OUTPATIENT_CLINIC_OR_DEPARTMENT_OTHER): Payer: Self-pay

## 2022-01-22 ENCOUNTER — Ambulatory Visit (INDEPENDENT_AMBULATORY_CARE_PROVIDER_SITE_OTHER): Payer: BC Managed Care – PPO | Admitting: Nurse Practitioner

## 2022-01-22 ENCOUNTER — Encounter (HOSPITAL_BASED_OUTPATIENT_CLINIC_OR_DEPARTMENT_OTHER): Payer: Self-pay | Admitting: Nurse Practitioner

## 2022-01-22 VITALS — BP 136/74 | HR 65 | Ht 64.0 in | Wt 169.0 lb

## 2022-01-22 DIAGNOSIS — E669 Obesity, unspecified: Secondary | ICD-10-CM

## 2022-01-22 DIAGNOSIS — E781 Pure hyperglyceridemia: Secondary | ICD-10-CM | POA: Diagnosis not present

## 2022-01-22 DIAGNOSIS — Z23 Encounter for immunization: Secondary | ICD-10-CM | POA: Diagnosis not present

## 2022-01-22 DIAGNOSIS — K760 Fatty (change of) liver, not elsewhere classified: Secondary | ICD-10-CM

## 2022-01-22 DIAGNOSIS — Z0000 Encounter for general adult medical examination without abnormal findings: Secondary | ICD-10-CM

## 2022-01-22 HISTORY — DX: Encounter for general adult medical examination without abnormal findings: Z00.00

## 2022-01-22 MED ORDER — ZOSTER VAC RECOMB ADJUVANTED 50 MCG/0.5ML IM SUSR
0.5000 mL | Freq: Once | INTRAMUSCULAR | 1 refills | Status: AC
  Filled 2022-01-22: qty 0.5, 1d supply, fill #0
  Filled 2022-05-11: qty 0.5, 1d supply, fill #1

## 2022-01-22 NOTE — Patient Instructions (Addendum)
It was a pleasure seeing you today. I hope your time spent with [place] was pleasant and helpful. Please let us know if there is anything we can do to improve the service you receive.   Everything looks great today! Your labs are all pretty good, as well. Keep up the great work!! We will plan to see you back in a year unless you have any urgent needs.      Important Office Information Lab Results If labs were ordered, please note that you will see results through MyChart as soon as they come available from [facility].  It takes up to 5 business days for the results to be routed to me and for me to review them once all of the lab results have come through from [facility]. I will make recommendations based on your results and send these through MyChart or someone from the office will call you to discuss. If your labs are abnormal, we may contact you to schedule a visit to discuss the results and make recommendations.  If you have not heard from [place] within 5 business days or you have waited longer than a week and your lab results have not come through on MyChart, please feel free to call the office or send a message through MyChart to follow-up on these labs.   Referrals If referrals were placed today, the office where the referral was sent will contact you either by phone or through MyChart to set up scheduling. Please note that it can take up to a week for the referral office to contact you. If you do not hear from them in a week, please contact the referral office directly to inquire about scheduling.   Condition Treated If your condition worsens or you begin to have new symptoms, please schedule a follow-up appointment for further evaluation. If you are not sure if an appointment is needed, you may call the office to leave a message for the nurse and someone will contact you with recommendations.  If you have an urgent or life threatening emergency, please do not call the office, but seek emergency  evaluation by calling 911 or going to the nearest emergency room for evaluation.   MyChart and Phone Calls Please do not use MyChart for urgent messages. It may take up to 3 business days for MyChart messages to be read by staff and if they are unable to handle the request, an additional 3 business days for them to be routed to me and for my response.  Messages sent to the provider through MyChart do not come directly to the provider, please allow time for these messages to be routed and for me to respond.  We get a large volume of MyChart messages daily and these are responded to in the order received.   For urgent messages, please call the office at [phone] and speak with the front office staff or leave a message on the line of my assistant for guidance.  We are seeing patients from the hours of 8:00 am through 5:00 pm and calls directly to the nurse may not be answered immediately due to seeing patients, but your call will be returned as soon as possible.  Phone  messages received after 4:00 PM Monday through Thursday may not be returned until the following business day. Phone messages received after 11:00 AM on Friday may not be returned until Monday.   After Hours We share on call hours with providers from other offices. If you have an urgent  need after hours that cannot wait until the next business day, please contact the on call provider by calling the office number. A nurse will speak with you and contact the provider if needed for recommendations.  If you have an urgent or life threatening emergency after hours, please do not call the on call provider, but seek emergency evaluation by calling 911 or going to the nearest emergency room for evaluation.   Paperwork All paperwork requires a minimum of 5 days to complete and return to you or the designated personnel. Please keep this in mind when bringing in forms or sending requests for paperwork completion to the office.

## 2022-01-22 NOTE — Assessment & Plan Note (Signed)
Historical- this has now resolved with diet and exercise changes. No changes to plan of care.

## 2022-01-22 NOTE — Assessment & Plan Note (Signed)
CPE today with no alarm symptoms or concerns.  Review of most recent labs from outside source with patient. No concerns Review of HM with patient- Zoster prescription provided for patient today.  Anticipatory guidance provided.  Recommend flu shot in the fall (Sept/Oct) for flu protection.  Will f/u in 1 year or sooner if needed.

## 2022-01-22 NOTE — Progress Notes (Signed)
BP 136/74   Pulse 65   Ht  (1.626 m)   Wt 169 lb (76.7 kg)   SpO2 98%   BMI 29.01 kg/m    Subjective:    Patient ID: Catherine Haley, female    DOB: [birth year], 56 y.o.   MRN: [id number]  HPI: Catherine Haley is a 56 y.o. female presenting on 01/22/2022 for comprehensive medical examination.   Current medical concerns include:none  She reports regular vision exams q1-5y: yes She reports regular dental exams q 23m: yes Her diet consists of:  primarily healthy options- watching cholesterol She endorses exercise and/or activity of:  walking daily She works in:  Haematologist  She endorses ETOH use ( less than once a month ) She denies nictoine use  She denies illegal substance use   She reports post menopausal state with no bleeding.  She is not currently sexually active and has no concerns with STI today.   She denies concerns about skin changes today  She denies concerns about bowel changes today  She denies concerns about bladder changes today   Most Recent Depression Screen:     01/22/2022    8:13 AM 11/29/2021    8:00 PM 07/04/2020   10:51 AM 02/05/2019   11:14 AM 01/28/2018   11:28 AM  Depression screen PHQ 2/9  Decreased Interest 1 0 0 0 0  Down, Depressed, Hopeless 0 0 0 0 0  PHQ - 2 Score 1 0 0 0 0  Altered sleeping 0      Tired, decreased energy 0      Change in appetite 0      Feeling bad or failure about yourself  1      Trouble concentrating 0      Moving slowly or fidgety/restless 0      Suicidal thoughts 0      PHQ-9 Score 2      Difficult doing work/chores Not difficult at all       Most Recent Anxiety Screen:      No data to display         Most Recent Fall Screen:    01/22/2022    8:13 AM 11/29/2021    8:00 PM 02/05/2019   11:14 AM  Fall Risk   Falls in the past year? 0 0 0  Number falls in past yr: 0 0 0  Injury with Fall? 0 0 0  Risk for fall due to : No Fall Risks No Fall Risks   Follow up Falls evaluation completed;Education provided Falls  evaluation completed Falls evaluation completed    All ROS negative except what is listed above and in the HPI.   Past medical history, surgical history, medications, allergies, family history and social history reviewed with patient today and changes made to appropriate areas of the chart.  Past Medical History:  Past Medical History:  Diagnosis Date   Cholelithiasis 03/14/2021   [place] 02/2021   GERD (gastroesophageal reflux disease) 02/2021   Hepatic steatosis 03/14/2021   By ultrasound for elevated lfts 02/2021   Hypertriglyceridemia 02/09/2019   Mild; rec fish oil bid 01/2019   Obesity (BMI 30-39.9) 01/28/2018   Medications:  Current Outpatient Medications on File Prior to Visit  Medication Sig   Ascorbic Acid (VITAMIN C PO) Take by mouth.   Multiple Vitamin (MULTIVITAMIN WITH MINERALS) TABS tablet Take 1 tablet by mouth daily.   Omega-3 Fatty Acids (FISH OIL PO) Take 1 tablet by mouth daily.   VITAMIN  D PO Take by mouth.   No current facility-administered medications on file prior to visit.   Surgical History:  Past Surgical History:  Procedure Laterality Date   CHOLECYSTECTOMY N/A 06/28/2021   Procedure: LAPAROSCOPIC CHOLECYSTECTOMY WITH INTRAOPERATIVE CHOLANGIOGRAM;  Surgeon: Griselda Miner, MD;  Location: [facility] OR;  Service: General;  Laterality: N/A;   EYE SURGERY Bilateral    lasix   MANDIBLE FRACTURE SURGERY     Allergies:  No Known Allergies Social History:  Social History   Socioeconomic History   Marital status: Married    Spouse name: Jillyn Hidden   Number of children: 0   Years of education: Not on file   Highest education level: Not on file  Occupational History   Occupation: Doctor, hospital,     Employer: [employer]  Tobacco Use   Smoking status: Never   Smokeless tobacco: Never  Vaping Use   Vaping Use: Never used  Substance and Sexual Activity   Alcohol use: No   Drug use: No   Sexual activity: Not Currently    Birth control/protection: None  Other Topics  Concern   Not on file  Social History Narrative   Not on file   Social Determinants of Health   Financial Resource Strain: Not on file  Food Insecurity: Not on file  Transportation Needs: Not on file  Physical Activity: Not on file  Stress: Not on file  Social Connections: Not on file  Intimate Partner Violence: Not on file   Social History   Tobacco Use  Smoking Status Never  Smokeless Tobacco Never   Social History   Substance and Sexual Activity  Alcohol Use No   Family History:  Family History  Problem Relation Age of Onset   Heart disease Mother 54   Diverticulitis Mother    Breast cancer Mother        postmenopausal   Cancer Mother    Hyperlipidemia Father    Hypertension Father    Arthritis Father    Colon cancer Father 68   Heart disease Father 47       CABG   Cancer Father    Heart disease Maternal Grandmother    Heart disease Maternal Grandfather    Heart disease Paternal Grandmother    Alcohol abuse Paternal Grandmother    Heart disease Paternal Grandfather    Alcohol abuse Paternal Grandfather    Hypertension Brother        Objective:    BP 136/74   Pulse 65   Ht 5\' 4"  (1.626 m)   Wt 169 lb (76.7 kg)   SpO2 98%   BMI 29.01 kg/m   Wt Readings from Last 3 Encounters:  01/22/22 169 lb (76.7 kg)  11/17/21 173 lb 3.2 oz (78.6 kg)  06/28/21 170 lb (77.1 kg)    Physical Exam Vitals and nursing note reviewed.  Constitutional:      General: She is not in acute distress.    Appearance: Normal appearance.  HENT:     Head: Normocephalic and atraumatic.     Right Ear: Hearing, tympanic membrane, ear canal and external ear normal.     Left Ear: Hearing, tympanic membrane, ear canal and external ear normal.     Nose: Nose normal.     Right Sinus: No maxillary sinus tenderness or frontal sinus tenderness.     Left Sinus: No maxillary sinus tenderness or frontal sinus tenderness.     Mouth/Throat:     Lips: Pink.     Mouth:  Mucous membranes  are moist.     Pharynx: Oropharynx is clear.  Eyes:     General: Lids are normal. Vision grossly intact.     Extraocular Movements: Extraocular movements intact.     Conjunctiva/sclera: Conjunctivae normal.     Pupils: Pupils are equal, round, and reactive to light.     Funduscopic exam:    Right eye: Red reflex present.        Left eye: Red reflex present.    Visual Fields: Right eye visual fields normal and left eye visual fields normal.  Neck:     Thyroid: No thyromegaly.     Vascular: No carotid bruit.  Cardiovascular:     Rate and Rhythm: Normal rate and regular rhythm.     Chest Wall: PMI is not displaced.     Pulses: Normal pulses.          Dorsalis pedis pulses are 2+ on the right side and 2+ on the left side.       Posterior tibial pulses are 2+ on the right side and 2+ on the left side.     Heart sounds: Normal heart sounds. No murmur heard. Pulmonary:     Effort: Pulmonary effort is normal. No respiratory distress.     Breath sounds: Normal breath sounds.  Abdominal:     General: Abdomen is flat. Bowel sounds are normal. There is no distension.     Palpations: Abdomen is soft. There is no hepatomegaly, splenomegaly or mass.     Tenderness: There is no abdominal tenderness. There is no right CVA tenderness, left CVA tenderness, guarding or rebound.  Musculoskeletal:        General: Normal range of motion.     Cervical back: Full passive range of motion without pain, normal range of motion and neck supple. No tenderness.     Right lower leg: No edema.     Left lower leg: No edema.  Feet:     Left foot:     Toenail Condition: Left toenails are normal.  Lymphadenopathy:     Cervical: No cervical adenopathy.     Upper Body:     Right upper body: No supraclavicular adenopathy.     Left upper body: No supraclavicular adenopathy.  Skin:    General: Skin is warm and dry.     Capillary Refill: Capillary refill takes less than 2 seconds.     Nails: There is no clubbing.   Neurological:     General: No focal deficit present.     Mental Status: She is alert and oriented to person, place, and time.     GCS: GCS eye subscore is 4. GCS verbal subscore is 5. GCS motor subscore is 6.     Sensory: Sensation is intact.     Motor: Motor function is intact.     Coordination: Coordination is intact.     Gait: Gait is intact.     Deep Tendon Reflexes: Reflexes are normal and symmetric.  Psychiatric:        Attention and Perception: Attention normal.        Mood and Affect: Mood normal.        Speech: Speech normal.        Behavior: Behavior normal. Behavior is cooperative.        Thought Content: Thought content normal.        Cognition and Memory: Cognition and memory normal.        Judgment: Judgment normal.  Results for orders placed or performed in visit on 11/17/21  Lab report - scanned  Result Value Ref Range   Comprehensive Metabolic Panel     CBC and Platelet Ct     Lipids     Testosterone,Free     Hemoglobin-A1c     Thyroxine (T4)     DHEA-Sulfate, Serum     TSH     LH     FSH     Estradiol     IGF Binding Protein 1     Vitamin D, 25-Hydroxy     hs-CRP     Homocyst(e)ine, P/S     Magnesium     Progesterone     Apolipoprotein B        Assessment & Plan:   Problem List Items Addressed This Visit     Obesity (BMI 30-39.9)    Nearly 30lb weight loss in a little over a year. 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 BMI now 29.01. Recommend keeping up with the current routine.       Hypertriglyceridemia    Historical- this has now resolved with diet and exercise changes. No changes to plan of care.       Hepatic steatosis    Very mild elevation in ALT with most recent labs, but significant improvement. She has has lost nearly 30lbs since last year, which is wonderful. She is not having any symptoms or concerns at this time.  Recommend continued walking and dietary monitoring- avoid foods high in saturated fat.        Encounter for annual physical exam    CPE today with no alarm symptoms or concerns.  Review of most recent labs from outside source with patient. No concerns Review of HM with patient- Zoster prescription provided for patient today.  Anticipatory guidance provided.  Recommend flu shot in the fall (Sept/Oct) for flu protection.  Will f/u in 1 year or sooner if needed.       Other Visit Diagnoses     Health care maintenance    -  Primary   Relevant Medications   Zoster Vaccine Adjuvanted [facility]) injection   Need for shingles vaccine       Relevant Medications   Zoster Vaccine Adjuvanted [facility]) injection         IMMUNIZATIONS:   - Tdap: Tetanus vaccination status reviewed: last tetanus booster within 10 years. - Influenza: Postponed to flu season - Pneumovax: Not applicable - Prevnar: Not applicable - HPV: Not applicable - Zostavax vaccine:  prescription provided today  SCREENING: - Pap smear: up to date - STI testing: deferred -Mammogram: Up to date  - Colonoscopy: Up to date  - Bone Density: Not applicable  -Hearing Test: Not applicable  -Spirometry: Not applicable   Follow up plan: Return in about 1 year (around 01/23/2023) for CPE in 1 year.  NEXT PREVENTATIVE PHYSICAL DUE IN 1 YEAR.  PATIENT COUNSELING PROVIDED:   For all adult patients, I recommend   A well balanced diet low in saturated fats, cholesterol, and moderation in carbohydrates.   This can be as simple as monitoring portion sizes and cutting back on sugary beverages such as soda and juice to start with.    Daily water consumption of at least 64 ounces.  Physical activity at least 180 minutes per week, if just starting out.   This can be as simple as taking the stairs instead of the elevator and walking 2-3 laps around the office  purposefully every day.   STD protection, partner selection, and regular testing if high risk.  Limited consumption of alcoholic beverages if alcohol is  consumed.  For women, I recommend no more than 7 alcoholic beverages per week, spread out throughout the week.  Avoid "binge" drinking or consuming large quantities of alcohol in one setting.   Please let me know if you feel you may need help with reduction or quitting alcohol consumption.   Avoidance of nicotine, if used.  Please let me know if you feel you may need help with reduction or quitting nicotine use.   Daily mental health attention.  This can be in the form of 5 minute daily meditation, prayer, journaling, yoga, reflection, etc.   Purposeful attention to your emotions and mental state can significantly improve your overall wellbeing and Health.  Please know that I am here to help you with all of your health care goals and am happy to work with you to find a solution that works best for you.  The greatest advice I have received with any changes in life are to take it one step at a time, that even means if all you can focus on is the next 60 seconds, then do that and celebrate your victories.  With any changes in life, you will have set backs, and that is OK. The important thing to remember is, if you have a set back, it is not a failure, it is an opportunity to try again!  Health Maintenance Recommendations Screening Testing Mammogram Every 1 -2 years based on history and risk factors Starting at age 57 Pap Smear Ages 21-39 every 3 years Ages 5630-65 every 5 years with HPV testing More frequent testing may be required based on results and history Colon Cancer Screening Every 1-10 years based on test performed, risk factors, and history Starting at age 57 Bone Density Screening Every 2-10 years based on history Starting at age 57 for women Recommendations for men differ based on medication usage, history, and risk factors AAA Screening One time ultrasound Men 5465-57 years old who have every smoked Lung Cancer Screening Low Dose Lung CT every 12 months Age 64-80 years with  a 30 pack-year smoking history who still smoke or who have quit within the last 15 years  Screening Labs Routine  Labs: Complete Blood Count (CBC), Complete Metabolic Panel (CMP), Cholesterol (Lipid Panel) Every 6-12 months based on history and medications May be recommended more frequently based on current conditions or previous results Hemoglobin A1c Lab Every 3-12 months based on history and previous results Starting at age 57 or earlier with diagnosis of diabetes, high cholesterol, BMI >26, and/or risk factors Frequent monitoring for patients with diabetes to ensure blood sugar control Thyroid Panel (TSH w/ T3 & T4) Every 6 months based on history, symptoms, and risk factors May be repeated more often if on medication HIV One time testing for all patients 3513 and older May be repeated more frequently for patients with increased risk factors or exposure Hepatitis C One time testing for all patients 2918 and older May be repeated more frequently for patients with increased risk factors or exposure Gonorrhea, Chlamydia Every 12 months for all sexually active persons 13-24 years Additional monitoring may be recommended for those who are considered high risk or who have symptoms PSA Men 7540-57 years old with risk factors Additional screening may be recommended from age 955-69 based on risk factors, symptoms, and history  Vaccine Recommendations Tetanus Booster All adults  every 10 years Flu Vaccine All patients 6 months and older every year COVID Vaccine All patients 12 years and older Initial dosing with booster May recommend additional booster based on age and health history HPV Vaccine 2 doses all patients age 35-26 Dosing may be considered for patients over 26 Shingles Vaccine (Shingrix) 2 doses all adults 55 years and older Pneumonia (Pneumovax 23) All adults 65 years and older May recommend earlier dosing based on health history Pneumonia (Prevnar 13) All adults 65 years and  older Dosed 1 year after Pneumovax 23  Additional Screening, Testing, and Vaccinations may be recommended on an individualized basis based on family history, health history, risk factors, and/or exposure.

## 2022-01-22 NOTE — Assessment & Plan Note (Signed)
Nearly 30lb weight loss in a little over a year. 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 BMI now 29.01. Recommend keeping up with the current routine.

## 2022-01-22 NOTE — Assessment & Plan Note (Signed)
Very mild elevation in ALT with most recent labs, but significant improvement. She has has lost nearly 30lbs since last year, which is wonderful. She is not having any symptoms or concerns at this time.  Recommend continued walking and dietary monitoring- avoid foods high in saturated fat.

## 2022-04-12 ENCOUNTER — Other Ambulatory Visit (HOSPITAL_BASED_OUTPATIENT_CLINIC_OR_DEPARTMENT_OTHER): Payer: Self-pay

## 2022-05-11 ENCOUNTER — Other Ambulatory Visit (HOSPITAL_BASED_OUTPATIENT_CLINIC_OR_DEPARTMENT_OTHER): Payer: Self-pay

## 2022-05-14 IMAGING — RF DG CHOLANGIOGRAM OPERATIVE
1 series · 4 of 4 positions shown · non-contrast
Comparison: None.
COMPARISON: None.

Addendum:
CLINICAL DATA: 85-year-old female with a history of cholelithiasis

EXAM:
INTRAOPERATIVE CHOLANGIOGRAM
TECHNIQUE: Cholangiographic images from the C-arm fluoroscopic device were
submitted for interpretation post-operatively. Please see the
procedural report for the amount of contrast and the fluoroscopy
time utilized.
CLINICAL DATA: 55-year-old female with a history of cholelithiasis
*** End of Addendum ***

[Series 1: dg cholangiogram · 0.20mm/px · 4 of 86 frames shown]
[frame 1/86]
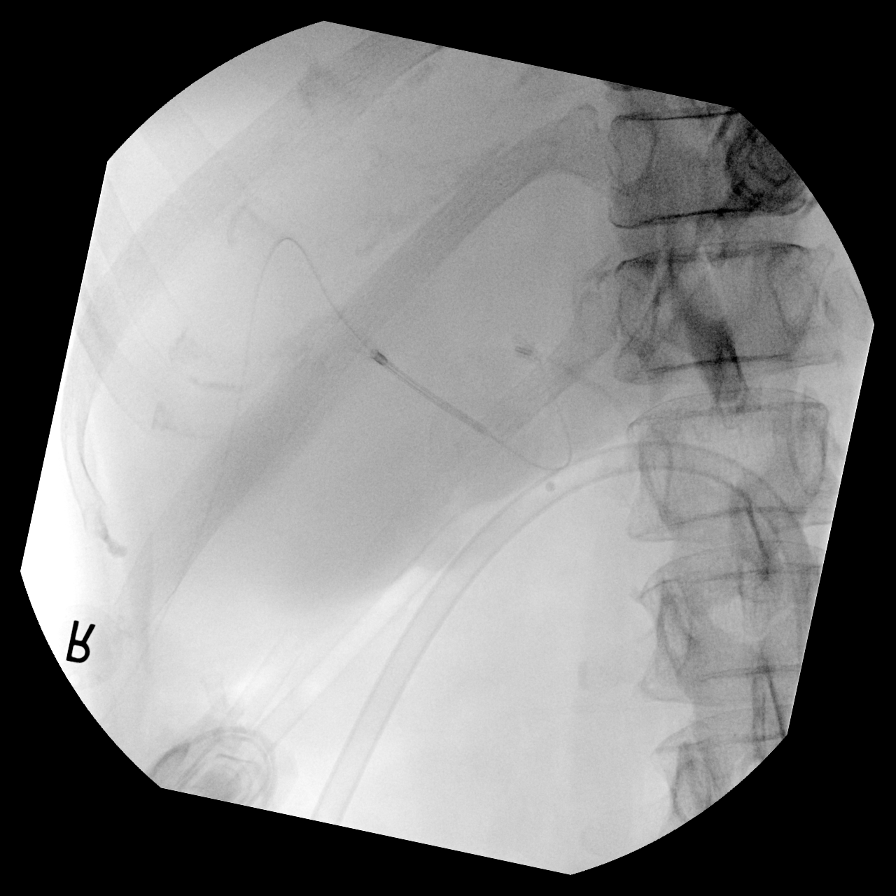
[frame 13/86]
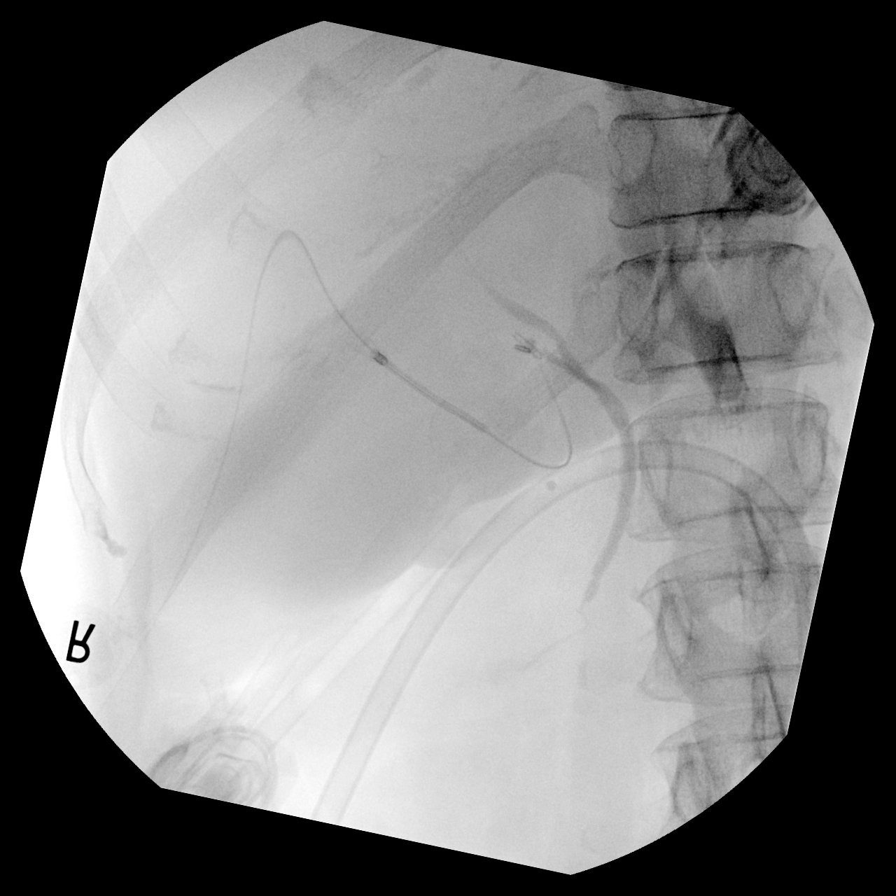
[frame 44/86]
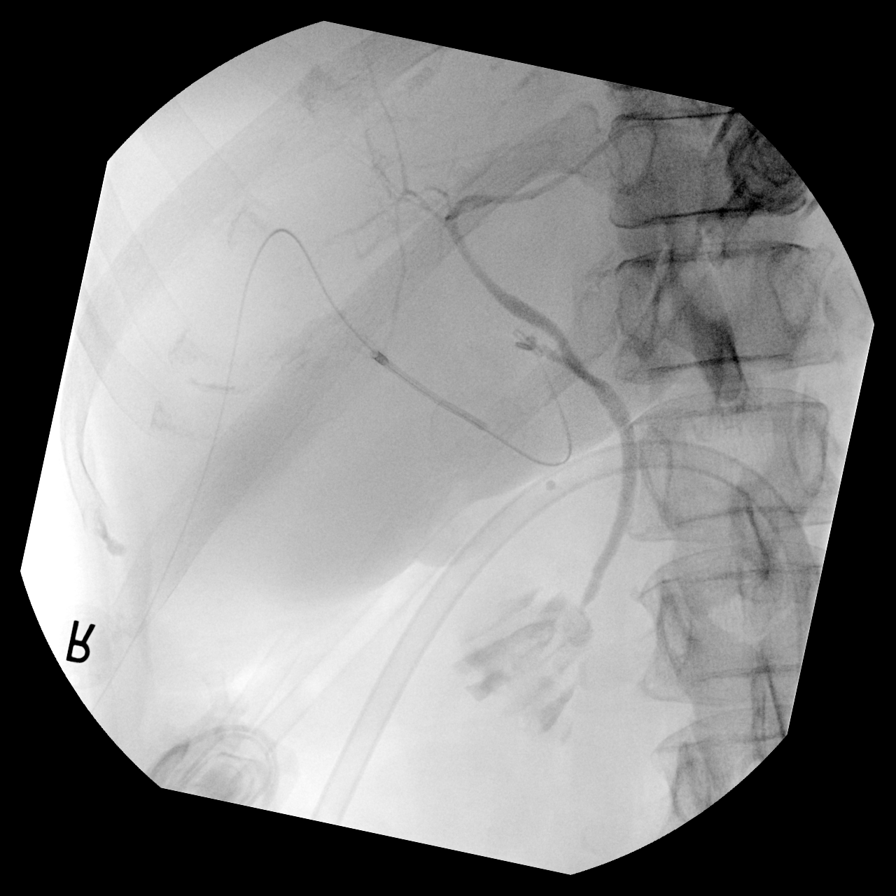
[frame 74/86]
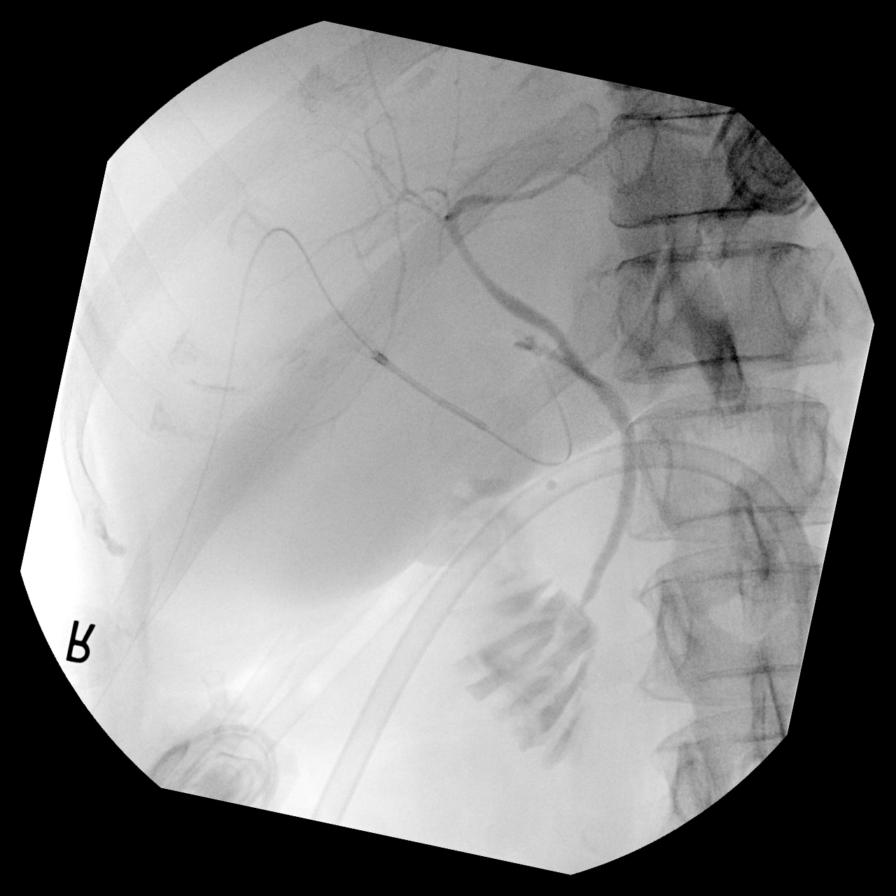

[4 of 4 positions shown; findings below may reference images not displayed]

FINDINGS: Surgical instruments project over the upper abdomen.

There is cannulation of the cystic duct/gallbladder neck, with
antegrade infusion of contrast. Caliber of the extrahepatic ductal
system within normal limits.

No definite filling defect within the extrahepatic ducts identified.

Free flow of contrast across the ampulla.
IMPRESSION: Intraoperative cholangiogram demonstrates extrahepatic biliary ducts
of unremarkable caliber, with no definite filling defects
identified. Free flow of contrast across the ampulla.

Please refer to the dictated operative report for full details of
intraoperative findings and procedure
FINDINGS: Surgical instruments project over the upper abdomen.

There is cannulation of the cystic duct/gallbladder neck, with
antegrade infusion of contrast. Caliber of the extrahepatic ductal
system within normal limits.

No definite filling defect within the extrahepatic ducts identified.

Free flow of contrast across the ampulla.
IMPRESSION: Intraoperative cholangiogram demonstrates extrahepatic biliary ducts
of unremarkable caliber, with no definite filling defects
identified. Free flow of contrast across the ampulla.

Please refer to the dictated operative report for full details of
intraoperative findings and procedure

## 2022-07-02 DIAGNOSIS — Z1231 Encounter for screening mammogram for malignant neoplasm of breast: Secondary | ICD-10-CM | POA: Diagnosis not present

## 2022-07-02 LAB — HM MAMMOGRAPHY

## 2022-07-05 ENCOUNTER — Encounter: Payer: Self-pay | Admitting: Nurse Practitioner

## 2022-07-17 LAB — LAB REPORT - SCANNED
A1c: 5.7
EGFR: 91

## 2022-10-19 DIAGNOSIS — Z7184 Encounter for health counseling related to travel: Secondary | ICD-10-CM | POA: Diagnosis not present

## 2022-11-09 ENCOUNTER — Encounter: Payer: Self-pay | Admitting: Nurse Practitioner

## 2023-01-23 ENCOUNTER — Encounter: Payer: Self-pay | Admitting: Nurse Practitioner

## 2023-01-24 ENCOUNTER — Encounter (HOSPITAL_BASED_OUTPATIENT_CLINIC_OR_DEPARTMENT_OTHER): Payer: BC Managed Care – PPO | Admitting: Nurse Practitioner

## 2023-01-24 ENCOUNTER — Ambulatory Visit (INDEPENDENT_AMBULATORY_CARE_PROVIDER_SITE_OTHER): Payer: BC Managed Care – PPO | Admitting: Nurse Practitioner

## 2023-01-24 ENCOUNTER — Encounter: Payer: Self-pay | Admitting: Nurse Practitioner

## 2023-01-24 VITALS — BP 120/80 | HR 60 | Ht 65.0 in | Wt 172.2 lb

## 2023-01-24 DIAGNOSIS — Z0000 Encounter for general adult medical examination without abnormal findings: Secondary | ICD-10-CM

## 2023-01-24 DIAGNOSIS — E781 Pure hyperglyceridemia: Secondary | ICD-10-CM | POA: Diagnosis not present

## 2023-01-24 DIAGNOSIS — K760 Fatty (change of) liver, not elsewhere classified: Secondary | ICD-10-CM | POA: Diagnosis not present

## 2023-01-24 DIAGNOSIS — R7303 Prediabetes: Secondary | ICD-10-CM | POA: Insufficient documentation

## 2023-01-24 HISTORY — DX: Prediabetes: R73.03

## 2023-01-24 NOTE — Progress Notes (Signed)
Shawna Clamp, DNP, AGNP-c [facility] [address] [place], [place] [address] Main Office [phone]  BP 120/80   Pulse 60   Ht 5\' 5"  (1.651 m)   Wt 172 lb 3.2 oz (78.1 kg)   BMI 28.66 kg/m    Subjective:    Patient ID: Catherine Haley, female    DOB: 07/30/1965, 57 y.o.   MRN: [id number]  HPI: Catherine Haley is a 57 y.o. female presenting on 01/24/2023 for comprehensive medical examination.   Current medical concerns include:none  She has had labs completed with her online wellness provider and sent these in through a MyChart message. We reviewed these today.   She has these done at [facility] 01/22/2022 and the second in December   A comprehensive review of systems was negative.  IMMUNIZATIONS:   Flu: Flu vaccine declined, patient will complete later Prevnar 13: Prevnar 13 N/A for this patient Prevnar 20: Prevnar 20 N/A for this patient Pneumovax 23: Pneumovax 23 N/A for this patient Vac Shingrix: Shingrix due, HPV: HPV N/A for this patient Tetanus: Tetanus completed in the last 10 years COVID: COVID completed, documentation in chart   HEALTH MAINTENANCE: Pap Smear HM Status: due in near future Mammogram HM Status: is up to date Colon Cancer Screening HM Status: is up to date Bone Density HM Status: is not applicable for this patient STI Testing HM Status: was declined  Lung CT HM Status: is not applicable for this patient  She denies concerns with vision, hearing, or dentition.  She eats a mix of healthy options with some unhealthy dietary options and exercises by walking every day.   Most Recent Depression Screen:     01/24/2023    8:25 AM 01/22/2022    8:13 AM 11/29/2021    8:00 PM 07/04/2020   10:51 AM 02/05/2019   11:14 AM  Depression screen PHQ 2/9  Decreased Interest 0 1 0 0 0  Down, Depressed, Hopeless 0 0 0 0 0  PHQ - 2 Score 0 1 0 0 0  Altered sleeping  0     Tired, decreased energy  0     Change in appetite  0     Feeling bad or  failure about yourself   1     Trouble concentrating  0     Moving slowly or fidgety/restless  0     Suicidal thoughts  0     PHQ-9 Score  2     Difficult doing work/chores  Not difficult at all      Most Recent Anxiety Screen:      No data to display         Most Recent Fall Screen:    01/24/2023    8:25 AM 01/22/2022    8:13 AM 11/29/2021    8:00 PM 02/05/2019   11:14 AM  Fall Risk   Falls in the past year? 0 0 0 0  Number falls in past yr: 0 0 0 0  Injury with Fall? 0 0 0 0  Risk for fall due to : No Fall Risks No Fall Risks No Fall Risks   Follow up Falls evaluation completed Falls evaluation completed;Education provided Falls evaluation completed Falls evaluation completed    Past medical history, surgical history, medications, allergies, family history and social history reviewed with patient today and changes made to appropriate areas of the chart.  Past Medical History:  Past Medical History:  Diagnosis Date   Cholelithiasis 03/14/2021   [place] 02/2021  GERD (gastroesophageal reflux disease) 02/2021   Hepatic steatosis 03/14/2021   By ultrasound for elevated lfts 02/2021   Hypertriglyceridemia 02/09/2019   Mild; rec fish oil bid 01/2019   Obesity (BMI 30-39.9) 01/28/2018   Medications:  Current Outpatient Medications on File Prior to Visit  Medication Sig   Ascorbic Acid (VITAMIN C PO) Take by mouth.   Multiple Vitamin (MULTIVITAMIN WITH MINERALS) TABS tablet Take 1 tablet by mouth daily.   Omega-3 Fatty Acids (FISH OIL PO) Take 1 tablet by mouth daily.   VITAMIN D PO Take by mouth.   No current facility-administered medications on file prior to visit.   Surgical History:  Past Surgical History:  Procedure Laterality Date   CHOLECYSTECTOMY N/A 06/28/2021   Procedure: LAPAROSCOPIC CHOLECYSTECTOMY WITH INTRAOPERATIVE CHOLANGIOGRAM;  Surgeon: Griselda Miner, MD;  Location: [facility] OR;  Service: General;  Laterality: N/A;   EYE SURGERY Bilateral    lasix   MANDIBLE  FRACTURE SURGERY     Allergies:  No Known Allergies Family History:  Family History  Problem Relation Age of Onset   Heart disease Mother 38   Diverticulitis Mother    Breast cancer Mother        postmenopausal   Cancer Mother    Hyperlipidemia Father    Hypertension Father    Arthritis Father    Colon cancer Father 80   Heart disease Father 52       CABG   Cancer Father    Heart disease Maternal Grandmother    Heart disease Maternal Grandfather    Heart disease Paternal Grandmother    Alcohol abuse Paternal Grandmother    Heart disease Paternal Grandfather    Alcohol abuse Paternal Grandfather    Hypertension Brother        Objective:    BP 120/80   Pulse 60   Ht 5\' 5"  (1.651 m)   Wt 172 lb 3.2 oz (78.1 kg)   BMI 28.66 kg/m   Wt Readings from Last 3 Encounters:  01/24/23 172 lb 3.2 oz (78.1 kg)  01/22/22 169 lb (76.7 kg)  11/17/21 173 lb 3.2 oz (78.6 kg)    Physical Exam Vitals and nursing note reviewed.  Constitutional:      General: She is not in acute distress.    Appearance: Normal appearance.  HENT:     Head: Normocephalic and atraumatic.     Right Ear: Hearing, tympanic membrane, ear canal and external ear normal.     Left Ear: Hearing, tympanic membrane, ear canal and external ear normal.     Nose: Nose normal.     Right Sinus: No maxillary sinus tenderness or frontal sinus tenderness.     Left Sinus: No maxillary sinus tenderness or frontal sinus tenderness.     Mouth/Throat:     Lips: Pink.     Mouth: Mucous membranes are moist.     Pharynx: Oropharynx is clear.  Eyes:     General: Lids are normal. Vision grossly intact.     Extraocular Movements: Extraocular movements intact.     Conjunctiva/sclera: Conjunctivae normal.     Pupils: Pupils are equal, round, and reactive to light.     Funduscopic exam:    Right eye: Red reflex present.        Left eye: Red reflex present.    Visual Fields: Right eye visual fields normal and left eye visual  fields normal.  Neck:     Thyroid: No thyromegaly.     Vascular: No  carotid bruit.  Cardiovascular:     Rate and Rhythm: Normal rate and regular rhythm.     Chest Wall: PMI is not displaced.     Pulses: Normal pulses.          Dorsalis pedis pulses are 2+ on the right side and 2+ on the left side.       Posterior tibial pulses are 2+ on the right side and 2+ on the left side.     Heart sounds: Normal heart sounds. No murmur heard. Pulmonary:     Effort: Pulmonary effort is normal. No respiratory distress.     Breath sounds: Normal breath sounds.  Abdominal:     General: Abdomen is flat. Bowel sounds are normal. There is no distension.     Palpations: Abdomen is soft. There is no hepatomegaly, splenomegaly or mass.     Tenderness: There is no abdominal tenderness. There is no right CVA tenderness, left CVA tenderness, guarding or rebound.  Musculoskeletal:        General: Normal range of motion.     Cervical back: Full passive range of motion without pain, normal range of motion and neck supple. No tenderness.     Right lower leg: No edema.     Left lower leg: No edema.  Feet:     Left foot:     Toenail Condition: Left toenails are normal.  Lymphadenopathy:     Cervical: No cervical adenopathy.     Upper Body:     Right upper body: No supraclavicular adenopathy.     Left upper body: No supraclavicular adenopathy.  Skin:    General: Skin is warm and dry.     Capillary Refill: Capillary refill takes less than 2 seconds.     Nails: There is no clubbing.  Neurological:     General: No focal deficit present.     Mental Status: She is alert and oriented to person, place, and time.     GCS: GCS eye subscore is 4. GCS verbal subscore is 5. GCS motor subscore is 6.     Sensory: Sensation is intact.     Motor: Motor function is intact.     Coordination: Coordination is intact.     Gait: Gait is intact.     Deep Tendon Reflexes: Reflexes are normal and symmetric.  Psychiatric:         Attention and Perception: Attention normal.        Mood and Affect: Mood normal.        Speech: Speech normal.        Behavior: Behavior normal. Behavior is cooperative.        Thought Content: Thought content normal.        Cognition and Memory: Cognition and memory normal.        Judgment: Judgment normal.     Results for orders placed or performed in visit on 11/02/22  Lab report - scanned  Result Value Ref Range   A1c 5.7    EGFR 91.0          Assessment & Plan:   Problem List Items Addressed This Visit     Hypertriglyceridemia    Recent labs show resolution of elevated triglycerides. Recommend continued diet and exercise.      Encounter for annual physical exam - Primary    CPE completed today. Review of HM activities and recommendations discussed and provided on AVS. Anticipatory guidance, diet, and exercise recommendations provided. Medications, allergies, and hx reviewed  and updated as necessary. Orders placed as listed below.  Plan: - Labs  reviewed . No changes needed based on her results.  - I will review these results and send recommendations via MyChart or a telephone call.  - F/U with CPE in 1 year or sooner for acute/chronic health needs as directed.        Pre-diabetes    Recent labs show slight elevation in A1c. She is working on diet and exercise. No alarm symptoms present at this time.  Plan: - Monitor carbohydrate intake and keep this to a minimum and increase protein       Hepatic steatosis       Follow up plan: Return in about 1 year (around 01/24/2024) for CPE.  NEXT PREVENTATIVE PHYSICAL DUE IN 1 YEAR.  PATIENT COUNSELING PROVIDED FOR ALL ADULT PATIENTS: A well balanced diet low in saturated fats, cholesterol, and moderation in carbohydrates.  This can be as simple as monitoring portion sizes and cutting back on sugary beverages such as soda and juice to start with.    Daily water consumption of at least 64 ounces.  Physical activity  at least 180 minutes per week.  If just starting out, start 10 minutes a day and work your way up.   This can be as simple as taking the stairs instead of the elevator and walking 2-3 laps around the office  purposefully every day.   STD protection, partner selection, and regular testing if high risk.  Limited consumption of alcoholic beverages if alcohol is consumed. For men, I recommend no more than 14 alcoholic beverages per week, spread out throughout the week (max 2 per day). Avoid "binge" drinking or consuming large quantities of alcohol in one setting.  Please let me know if you feel you may need help with reduction or quitting alcohol consumption.   Avoidance of nicotine, if used. Please let me know if you feel you may need help with reduction or quitting nicotine use.   Daily mental health attention. This can be in the form of 5 minute daily meditation, prayer, journaling, yoga, reflection, etc.  Purposeful attention to your emotions and mental state can significantly improve your overall wellbeing  and  Health.  Please know that I am here to help you with all of your health care goals and am happy to work with you to find a solution that works best for you.  The greatest advice I have received with any changes in life are to take it one step at a time, that even means if all you can focus on is the next 60 seconds, then do that and celebrate your victories.  With any changes in life, you will have set backs, and that is OK. The important thing to remember is, if you have a set back, it is not a failure, it is an opportunity to try again! Screening Testing Mammogram Every 1 -2 years based on history and risk factors Starting at age 75 Pap Smear Ages 21-39 every 3 years Ages 55-65 every 5 years with HPV testing More frequent testing may be required based on results and history Colon Cancer Screening Every 1-10 years based on test performed, risk factors, and history Starting at  age 61 Bone Density Screening Every 2-10 years based on history Starting at age 49 for women Recommendations for men differ based on medication usage, history, and risk factors AAA Screening One time ultrasound Men 51-41 years old who have every smoked Lung Cancer Screening  Low Dose Lung CT every 12 months Age 70-80 years with a 30 pack-year smoking history who still smoke or who have quit within the last 15 years   Screening Labs Routine  Labs: Complete Blood Count (CBC), Complete Metabolic Panel (CMP), Cholesterol (Lipid Panel) Every 6-12 months based on history and medications May be recommended more frequently based on current conditions or previous results Hemoglobin A1c Lab Every 3-12 months based on history and previous results Starting at age 1 or earlier with diagnosis of diabetes, high cholesterol, BMI >26, and/or risk factors Frequent monitoring for patients with diabetes to ensure blood sugar control Thyroid Panel (TSH) Every 6 months based on history, symptoms, and risk factors May be repeated more often if on medication HIV One time testing for all patients 33 and older May be repeated more frequently for patients with increased risk factors or exposure Hepatitis C One time testing for all patients 56 and older May be repeated more frequently for patients with increased risk factors or exposure Gonorrhea, Chlamydia Every 12 months for all sexually active persons 13-24 years Additional monitoring may be recommended for those who are considered high risk or who have symptoms Every 12 months for any woman on birth control, regardless of sexual activity PSA Men 20-9 years old with risk factors Additional screening may be recommended from age 60-69 based on risk factors, symptoms, and history  Vaccine Recommendations Tetanus Booster All adults every 10 years Flu Vaccine All patients 6 months and older every year COVID Vaccine All patients 12 years and  older Initial dosing with booster May recommend additional booster based on age and health history HPV Vaccine 2 doses all patients age 32-26 Dosing may be considered for patients over 26 Shingles Vaccine (Shingrix) 2 doses all adults 55 years and older Pneumonia (Pneumovax 23) All adults 65 years and older May recommend earlier dosing based on health history One year apart from Prevnar 13 Pneumonia (Prevnar 32) All adults 65 years and older Dosed 1 year after Pneumovax 23 Pneumonia (Prevnar 20) One time alternative to the two dosing of 13 and 23 For all adults with initial dose of 23, 20 is recommended 1 year later For all adults with initial dose of 13, 23 is still recommended as second option 1 year later

## 2023-01-24 NOTE — Assessment & Plan Note (Signed)
Recent labs show slight elevation in A1c. She is working on diet and exercise. No alarm symptoms present at this time.  Plan: - Monitor carbohydrate intake and keep this to a minimum and increase protein

## 2023-01-24 NOTE — Assessment & Plan Note (Signed)
Recent labs show resolution of elevated triglycerides. Recommend continued diet and exercise.

## 2023-01-24 NOTE — Assessment & Plan Note (Signed)
CPE completed today. Review of HM activities and recommendations discussed and provided on AVS. Anticipatory guidance, diet, and exercise recommendations provided. Medications, allergies, and hx reviewed and updated as necessary. Orders placed as listed below.  Plan: - Labs  reviewed . No changes needed based on her results.  - I will review these results and send recommendations via MyChart or a telephone call.  - F/U with CPE in 1 year or sooner for acute/chronic health needs as directed.

## 2023-01-24 NOTE — Patient Instructions (Addendum)
Everything looks great today! We will plan to do your pap next year ,if you would like me to send a referral to GYN for you to have this done just let me know.   Work on keeping your carbohydrate intake on the lower side (180 grams or less a day) and be sure to eat plenty of protein to help stabilize blood sugars.

## 2023-07-08 DIAGNOSIS — Z1231 Encounter for screening mammogram for malignant neoplasm of breast: Secondary | ICD-10-CM | POA: Diagnosis not present

## 2023-07-08 LAB — HM MAMMOGRAPHY

## 2023-07-09 ENCOUNTER — Encounter: Payer: Self-pay | Admitting: Nurse Practitioner

## 2023-09-26 ENCOUNTER — Ambulatory Visit: Attending: Nurse Practitioner

## 2023-09-26 ENCOUNTER — Ambulatory Visit: Admitting: Nurse Practitioner

## 2023-09-26 ENCOUNTER — Encounter: Payer: Self-pay | Admitting: Nurse Practitioner

## 2023-09-26 VITALS — BP 122/78 | HR 85 | Wt 179.0 lb

## 2023-09-26 DIAGNOSIS — I480 Paroxysmal atrial fibrillation: Secondary | ICD-10-CM

## 2023-09-26 NOTE — Progress Notes (Unsigned)
 EP to read.

## 2023-09-26 NOTE — Progress Notes (Unsigned)
  Annella Kief, DNP, AGNP-c [facility] [address] [place], [place] [address] [phone]   ACUTE VISIT- ESTABLISHED PATIENT  There were no vitals taken for this visit.  Subjective:  HPI Catherine Haley is a 58 y.o. female presents to day for evaluation of acute concern(s).      ROS negative except for what is listed in HPI. History, Medications, Surgery, SDOH, and Family History reviewed and updated as appropriate.  Objective:  Physical Exam Vitals and nursing note reviewed.  Constitutional:      General: She is not in acute distress.    Appearance: Normal appearance.  HENT:     Head: Normocephalic.  Eyes:     Conjunctiva/sclera: Conjunctivae normal.  Neck:     Vascular: No carotid bruit.  Cardiovascular:     Rate and Rhythm: Normal rate and regular rhythm.     Pulses: Normal pulses.     Heart sounds: Normal heart sounds.  Pulmonary:     Effort: Pulmonary effort is normal.     Breath sounds: Normal breath sounds.  Musculoskeletal:     Cervical back: Neck supple.     Right lower leg: No edema.     Left lower leg: No edema.  Skin:    General: Skin is warm and dry.     Capillary Refill: Capillary refill takes less than 2 seconds.  Neurological:     Mental Status: She is alert and oriented to person, place, and time.  Psychiatric:        Mood and Affect: Mood normal.        Behavior: Behavior normal.         Assessment & Plan:   Problem List Items Addressed This Visit   None     Annella Kief, DNP, AGNP-c

## 2023-09-30 DIAGNOSIS — I480 Paroxysmal atrial fibrillation: Secondary | ICD-10-CM | POA: Insufficient documentation

## 2023-09-30 HISTORY — DX: Paroxysmal atrial fibrillation: I48.0

## 2023-09-30 NOTE — Assessment & Plan Note (Signed)
 Intermittent episodes of atrial fibrillation detected by Apple Watch, with three episodes in one night. Currently asymptomatic. Possible stress-related trigger due to work stress. Family history of cardiac issues. Discussed the asymptomatic nature of paroxysmal atrial fibrillation and stress as a trigger. Explained risks of thromboembolic events due to irregular heartbeats. Emphasized monitoring and potential cardiology evaluation if irregularities are detected. Apple Watch accuracy in detecting AFib is 95-97%, therefore I suspect these are true episodes.  - Order Zio Patch for continuous cardiac monitoring for seven days. - Instruct to take a baby aspirin daily to reduce thromboembolic risk. May need to change based on zio findings and cardiology evaluation. - Consult cardiology if Zio Patch shows abnormalities. - Reassure that AFib is manageable, but treatment is often required to reduce blood clots.  - ER symptoms discussed and when to go to the ED.

## 2023-10-24 DIAGNOSIS — I480 Paroxysmal atrial fibrillation: Secondary | ICD-10-CM | POA: Diagnosis not present

## 2023-10-26 DIAGNOSIS — I480 Paroxysmal atrial fibrillation: Secondary | ICD-10-CM

## 2023-10-30 ENCOUNTER — Ambulatory Visit: Payer: Self-pay | Admitting: Nurse Practitioner

## 2023-11-04 NOTE — Addendum Note (Signed)
 Addended by: Donie Moulton, Abraham Hoffmann E on: 11/04/2023 06:26 PM   Modules accepted: Orders

## 2023-11-20 ENCOUNTER — Other Ambulatory Visit: Payer: Self-pay

## 2023-11-21 ENCOUNTER — Ambulatory Visit: Attending: Cardiology | Admitting: Cardiology

## 2023-11-21 ENCOUNTER — Encounter: Payer: Self-pay | Admitting: Cardiology

## 2023-11-21 VITALS — BP 130/80 | HR 67 | Ht 64.0 in | Wt 179.6 lb

## 2023-11-21 DIAGNOSIS — E669 Obesity, unspecified: Secondary | ICD-10-CM

## 2023-11-21 DIAGNOSIS — I480 Paroxysmal atrial fibrillation: Secondary | ICD-10-CM | POA: Diagnosis not present

## 2023-11-21 NOTE — Patient Instructions (Signed)
Medication Instructions:  Your physician recommends that you continue on your current medications as directed. Please refer to the Current Medication list given to you today.  *If you need a refill on your cardiac medications before your next appointment, please call your pharmacy*   Lab Work: None ordered If you have labs (blood work) drawn today and your tests are completely normal, you will receive your results only by: MyChart Message (if you have MyChart) OR A paper copy in the mail If you have any lab test that is abnormal or we need to change your treatment, we will call you to review the results.   Testing/Procedures:  Stress Echocardiogram Instructions:    1. You may take all of your medications.  2. No food, drink or tobacco products four hours prior to your test.  3. Dress prepared to exercise. Best to wear 2 piece outfit and tennis shoes. Shoes must be closed toe.  4. Please bring all current prescription medications.  Follow-Up: At [facility], you and your health needs are our priority.  As part of our continuing mission to provide you with exceptional heart care, we have created designated Provider Care Teams.  These Care Teams include your primary Cardiologist (physician) and Advanced Practice Providers (APPs -  Physician Assistants and Nurse Practitioners) who all work together to provide you with the care you need, when you need it.  We recommend signing up for the patient portal called "MyChart".  Sign up information is provided on this After Visit Summary.  MyChart is used to connect with patients for Virtual Visits (Telemedicine).  Patients are able to view lab/test results, encounter notes, upcoming appointments, etc.  Non-urgent messages can be sent to your provider as well.   To learn more about what you can do with MyChart, go to [email or web address].    Your next appointment:   9 month(s)  The format for your next appointment:   In  Person  Provider:   Belva Crome, MD   Other Instructions Exercise Stress Echocardiogram An exercise stress echocardiogram is a test to check how well your heart is working. This test uses sound waves and a computer to make pictures of your heart. These pictures will be taken before and after you exercise. For this test, you will walk on a treadmill or ride a bicycle to make your heart beat faster. While you exercise, your heart will be checked with an electrocardiogram (ECG). Your blood pressure will also be checked. You may have this test if: You have chest pain or a heart problem. You had a heart attack or heart surgery not long ago. You have heart valve problems. You have a condition that causes narrowing of the blood vessels that supply your heart. You have a high risk of heart disease and: You are starting a new exercise program. You need to have a big surgery. Tell a doctor about: Any allergies you have. All medicines you are taking. This includes vitamins, herbs, eye drops, creams, and over-the-counter medicines. Any problems you or family members have had with medicines that make you fall asleep (anesthetic medicines). Any surgeries you have had. Any blood disorders you have. Any medical conditions you have. Whether you are pregnant or may be pregnant. What are the risks? Generally, this is a safe test. However, problems may occur, including: Chest pain. Feeling dizzy or light-headed. Shortness of breath. Increased or irregular heartbeat. Feeling like you may vomit (nausea) or vomiting. Heart attack. This is very rare. What happens  before the test? Medicines Ask your doctor about changing or stopping your normal medicines. This is important if you take diabetes medicines or blood thinners. If you use an inhaler, bring it to the test. General instructions Wear comfortable clothes and walking shoes. Follow instructions from your doctor about what you cannot eat or  drink before the test. Do not drink or eat anything that has caffeine in it. Stop having caffeine 24 hours before the test. Do not smoke or use products that contain nicotine or tobacco for 4 hours before the test. If you need help quitting, ask your doctor. What happens during the test?  You will take off your clothes from the waist up and put on a hospital gown. Electrodes or patches will be put on your chest. A blood pressure cuff will be put on your arm. Before you exercise, a computer will make a picture of your heart. To do this: You will lie down and a gel will be put on your chest. A wand will be moved over the gel. Sound waves from the wand will go to the computer to make the picture. Then, you will start to exercise. You may walk on a treadmill or pedal a bicycle. Your blood pressure and heart rhythm will be checked while you exercise. The exercise will get harder or faster. You will exercise until: Your heart reaches a certain level. You are too tired to go on. You cannot go on because of chest pain, weakness, or dizziness. You will lie down right away so another picture of your heart can be taken. The procedure may vary among doctors and hospitals. What can I expect after the test? After your test, it is common to have: Mild soreness. Mild tiredness. Your heart rate and blood pressure will be checked until they return to your normal levels. You should not have any new symptoms after this test. Follow these instructions at home: If your doctor says that you can, you may: Eat what you normally eat. Do your normal activities. Take over-the-counter and prescription medicines only as told by your doctor. Keep all follow-up visits. It is up to you to get the results of your test. Ask how to get your results when they are ready. Contact a doctor if: You feel dizzy or light-headed. You have a fast or irregular heartbeat. You feel like you may vomit or you vomit. You have a  headache. You feel short of breath. Get help right away if: You develop pain or pressure: In your chest. In your jaw or neck. Between your shoulders. That goes down your left arm. You faint. You have trouble breathing. These symptoms may be an emergency. Get medical help right away. Call your local emergency services (911 in the [place] Do not wait to see if the symptoms will go away. Do not drive yourself to the hospital. Summary This is a test that checks how well your heart is working. Follow instructions about what you cannot eat or drink before the test. Ask your doctor if you should take your normal medicines before the test. Stop having caffeine 24 hours before the test. Do not smoke or use products with nicotine or tobacco in them for 4 hours before the test. During the test, your blood pressure and heart rhythm will be checked while you exercise. This information is not intended to replace advice given to you by your health care provider. Make sure you discuss any questions you have with your health care provider. Document  Revised: 01/25/2021 Document Reviewed: 01/05/2020 Elsevier Patient Education  2022 [employer].

## 2023-11-21 NOTE — Progress Notes (Signed)
 Cardiology Office Note:    Date:  11/21/2023   ID:  Catherine Haley, DOB 11/06/1965, MRN [id number]  PCP:  Oris Camie BRAVO, NP  Cardiologist:  Jennifer JONELLE Crape, MD   Referring MD: Oris Camie BRAVO, NP    ASSESSMENT:    1. Paroxysmal atrial fibrillation (HCC)   2. Obesity (BMI 30-39.9)    PLAN:    In order of problems listed above:  Primary prevention stressed with the patient.  Importance of compliance with diet medication stressed and patient verbalized standing. Paroxysmal atrial fibrillation: Her CHA2DS2-VASc score is 0.  She is not a candidate for anticoagulation.  However I discussed antiarrhythmic therapy with flecainide and she is agreeable.  She is planning to go away for 2 weeks on vacation and she will come back and would initiate flecainide 50 mg every 12 hours.  We will follow her EKG protocol and do a couple of EKGs in the next few weeks to monitor the QRS.  Benefits and potential risks of the medication explained to her and her husband and questions were answered to their satisfaction. Obesity: Stressed diet emphasized and she promises to do better. Dyspnea on exertion: Will do an exercise stress echo to assess this. Patient will be seen in follow-up appointment in 6 months or earlier if the patient has any concerns.    Medication Adjustments/Labs and Tests Ordered: Current medicines are reviewed at length with the patient today.  Concerns regarding medicines are outlined above.  Orders Placed This Encounter  Procedures   EKG 12-Lead   No orders of the defined types were placed in this encounter.    History of Present Illness:    Catherine Haley is a 58 y.o. female who is being seen today for the evaluation of paroxysmal atrial fibrillation at the request of Early, Camie BRAVO, NP.  Patient is a pleasant 58 year old female.  Her Apple watch showed atrial fibrillation with and therefore she underwent monitoring with ZIO monitor and this revealed atrial fibrillation paroxysms.   She had 8% of atrial fibrillation burden and so she is here to see me in follow-up.  She denies any chest pain orthopnea or PND.  No dizziness or syncope in the past several months.  At the time of my evaluation, the patient is alert awake oriented and in no distress.  She is an active lady.  She does not exercise on a regular basis.  At the time of my evaluation, the patient is alert awake oriented and in no distress.    Past Medical History:  Diagnosis Date   Encounter for annual physical exam 01/22/2022   GERD (gastroesophageal reflux disease) 02/2021   Hepatic steatosis 03/14/2021   By ultrasound for elevated lfts 02/2021   History of cholelithiasis 11/29/2021   Hypertriglyceridemia 02/09/2019   Mild; rec fish oil bid 01/2019   Obesity (BMI 30-39.9) 01/28/2018   Paroxysmal atrial fibrillation (HCC) 09/30/2023   Pre-diabetes 01/24/2023    Past Surgical History:  Procedure Laterality Date   CHOLECYSTECTOMY N/A 06/28/2021   Procedure: LAPAROSCOPIC CHOLECYSTECTOMY WITH INTRAOPERATIVE CHOLANGIOGRAM;  Surgeon: Curvin Deward MOULD, MD;  Location: [facility] OR;  Service: General;  Laterality: N/A;   EYE SURGERY Bilateral    lasix   MANDIBLE FRACTURE SURGERY      Current Medications: Current Meds  Medication Sig   Ascorbic Acid (VITAMIN C PO) Take 1 tablet by mouth daily.   DHEA 10 MG CAPS Take 10 mg by mouth daily.   Omega-3 Fatty Acids (FISH OIL PO)  Take 1 tablet by mouth daily.   VITAMIN D PO Take 1 tablet by mouth daily.     Allergies:   Patient has no known allergies.   Social History   Socioeconomic History   Marital status: Married    Spouse name: Arley   Number of children: 0   Years of education: Not on file   Highest education level: Not on file  Occupational History   Occupation: Doctor, hospital,     Employer: [employer]  Tobacco Use   Smoking status: Never   Smokeless tobacco: Never  Vaping Use   Vaping status: Never Used  Substance and Sexual Activity   Alcohol use: No    Drug use: No   Sexual activity: Not Currently    Birth control/protection: None  Other Topics Concern   Not on file  Social History Narrative   Not on file   Social Drivers of Health   Financial Resource Strain: Low Risk  (01/24/2023)   Overall Financial Resource Strain (CARDIA)    Difficulty of Paying Living Expenses: Not hard at all  Food Insecurity: No Food Insecurity (01/24/2023)   Hunger Vital Sign    Worried About Running Out of Food in the Last Year: Never true    Ran Out of Food in the Last Year: Never true  Transportation Needs: No Transportation Needs (01/24/2023)   PRAPARE - Administrator, Civil Service (Medical): No    Lack of Transportation (Non-Medical): No  Physical Activity: Sufficiently Active (01/24/2023)   Exercise Vital Sign    Days of Exercise per Week: 4 days    Minutes of Exercise per Session: 40 min  Stress: No Stress Concern Present (01/24/2023)   [employer] of Occupational Health - Occupational Stress Questionnaire    Feeling of Stress : Only a little  Social Connections: Socially Integrated (01/24/2023)   Social Connection and Isolation Panel    Frequency of Communication with Friends and Family: More than three times a week    Frequency of Social Gatherings with Friends and Family: Twice a week    Attends Religious Services: 1 to 4 times per year    Active Member of [employer] or Organizations: Yes    Attends Engineer, structural: More than 4 times per year    Marital Status: Married     Family History: The patient's family history includes Alcohol abuse in her paternal grandfather and paternal grandmother; Arthritis in her father; Breast cancer in her mother; Cancer in her father and mother; Colon cancer (age of onset: 6) in her father; Diverticulitis in her mother; Heart disease in her maternal grandfather, maternal grandmother, paternal grandfather, and paternal grandmother; Heart disease (age of onset: 21) in her father; Heart  disease (age of onset: 36) in her mother; Hyperlipidemia in her father; Hypertension in her brother and father.  ROS:   Please see the history of present illness.    All other systems reviewed and are negative.  EKGs/Labs/Other Studies Reviewed:    The following studies were reviewed today:  EKG Interpretation Date/Time:  Thursday November 21 2023 15:40:04 EDT Ventricular Rate:  67 PR Interval:  160 QRS Duration:  76 QT Interval:  376 QTC Calculation: 397 R Axis:   83  Text Interpretation: Normal sinus rhythm Normal ECG When compared with ECG of 18-Dec-2015 05:02, No significant change was found Confirmed by Edwyna Backers [phone]) on 11/21/2023 3:51:28 PM     Recent Labs: No results found for requested labs within last  365 days.  Recent Lipid Panel    Component Value Date/Time   CHOL 183 07/04/2020 1115   TRIG 270.0 (H) 07/04/2020 1115   HDL 33.70 (L) 07/04/2020 1115   CHOLHDL 5 07/04/2020 1115   VLDL 54.0 (H) 07/04/2020 1115   LDLDIRECT 134.0 07/04/2020 1115    Physical Exam:    VS:  BP 130/80   Pulse 67   Ht 5' 4 (1.626 m)   Wt 179 lb 9.6 oz (81.5 kg)   SpO2 97%   BMI 30.83 kg/m     Wt Readings from Last 3 Encounters:  11/21/23 179 lb 9.6 oz (81.5 kg)  09/26/23 179 lb (81.2 kg)  01/24/23 172 lb 3.2 oz (78.1 kg)     GEN: Patient is in no acute distress HEENT: Normal NECK: No JVD; No carotid bruits LYMPHATICS: No lymphadenopathy CARDIAC: S1 S2 regular, 2/6 systolic murmur at the apex. RESPIRATORY:  Clear to auscultation without rales, wheezing or rhonchi  ABDOMEN: Soft, non-tender, non-distended MUSCULOSKELETAL:  No edema; No deformity  SKIN: Warm and dry NEUROLOGIC:  Alert and oriented x 3 PSYCHIATRIC:  Normal affect    Signed, Jennifer JONELLE Crape, MD  11/21/2023 3:52 PM    [facility]

## 2023-12-04 ENCOUNTER — Encounter (HOSPITAL_COMMUNITY): Payer: Self-pay | Admitting: *Deleted

## 2023-12-07 ENCOUNTER — Encounter: Payer: Self-pay | Admitting: Cardiology

## 2023-12-09 ENCOUNTER — Telehealth (HOSPITAL_COMMUNITY): Payer: Self-pay

## 2023-12-09 NOTE — Telephone Encounter (Signed)
 Detailed instructions left on the patient's answering machine. Catherine Haley CCT

## 2023-12-13 ENCOUNTER — Ambulatory Visit (HOSPITAL_COMMUNITY)
Admission: RE | Admit: 2023-12-13 | Discharge: 2023-12-13 | Disposition: A | Discharge status: Home / Self Care | Source: Ambulatory Visit | Attending: Cardiology | Admitting: Cardiology

## 2023-12-13 DIAGNOSIS — R0602 Shortness of breath: Secondary | ICD-10-CM | POA: Diagnosis not present

## 2023-12-13 DIAGNOSIS — I480 Paroxysmal atrial fibrillation: Secondary | ICD-10-CM | POA: Diagnosis not present

## 2023-12-13 MED ORDER — PERFLUTREN LIPID MICROSPHERE
6.0000 mL | INTRAVENOUS | Status: AC | PRN
  Administered 2023-12-13: 6 mL via INTRAVENOUS

## 2023-12-13 MED FILL — Perflutren Lipid Microsphere IV Susp 1.1 MG/ML: INTRAVENOUS | Qty: 6 | Status: AC

## 2023-12-17 ENCOUNTER — Ambulatory Visit: Payer: Self-pay | Admitting: Cardiology

## 2023-12-17 DIAGNOSIS — R0602 Shortness of breath: Secondary | ICD-10-CM

## 2023-12-17 DIAGNOSIS — I480 Paroxysmal atrial fibrillation: Secondary | ICD-10-CM

## 2023-12-17 DIAGNOSIS — R9439 Abnormal result of other cardiovascular function study: Secondary | ICD-10-CM

## 2023-12-18 NOTE — Telephone Encounter (Signed)
 Cardiac CT order placed and message sent to pt with instructions in MyChart.

## 2023-12-27 ENCOUNTER — Ambulatory Visit (HOSPITAL_COMMUNITY)
Admission: RE | Admit: 2023-12-27 | Discharge: 2023-12-27 | Disposition: A | Discharge status: Home / Self Care | Source: Ambulatory Visit | Attending: Cardiology | Admitting: Cardiology

## 2023-12-27 DIAGNOSIS — R931 Abnormal findings on diagnostic imaging of heart and coronary circulation: Secondary | ICD-10-CM | POA: Insufficient documentation

## 2023-12-27 DIAGNOSIS — I2510 Atherosclerotic heart disease of native coronary artery without angina pectoris: Secondary | ICD-10-CM

## 2023-12-27 DIAGNOSIS — R0602 Shortness of breath: Secondary | ICD-10-CM | POA: Diagnosis not present

## 2023-12-27 DIAGNOSIS — I480 Paroxysmal atrial fibrillation: Secondary | ICD-10-CM | POA: Insufficient documentation

## 2023-12-27 DIAGNOSIS — R9439 Abnormal result of other cardiovascular function study: Secondary | ICD-10-CM | POA: Insufficient documentation

## 2023-12-27 MED ORDER — NITROGLYCERIN 0.4 MG SL SUBL
0.8000 mg | SUBLINGUAL_TABLET | Freq: Once | SUBLINGUAL | Status: AC
  Administered 2023-12-27: 0.8 mg via SUBLINGUAL

## 2023-12-27 MED ORDER — IOHEXOL 350 MG/ML SOLN
100.0000 mL | Freq: Once | INTRAVENOUS | Status: AC | PRN
  Administered 2023-12-27: 100 mL via INTRAVENOUS

## 2023-12-29 ENCOUNTER — Ambulatory Visit (HOSPITAL_BASED_OUTPATIENT_CLINIC_OR_DEPARTMENT_OTHER)
Admission: RE | Admit: 2023-12-29 | Discharge: 2023-12-29 | Disposition: A | Discharge status: Home / Self Care | Source: Ambulatory Visit | Attending: Cardiology | Admitting: Cardiology

## 2023-12-29 ENCOUNTER — Other Ambulatory Visit: Payer: Self-pay | Admitting: Cardiology

## 2023-12-29 DIAGNOSIS — R9439 Abnormal result of other cardiovascular function study: Secondary | ICD-10-CM | POA: Diagnosis not present

## 2023-12-29 DIAGNOSIS — R931 Abnormal findings on diagnostic imaging of heart and coronary circulation: Secondary | ICD-10-CM

## 2023-12-29 DIAGNOSIS — I480 Paroxysmal atrial fibrillation: Secondary | ICD-10-CM | POA: Diagnosis not present

## 2023-12-29 DIAGNOSIS — I2510 Atherosclerotic heart disease of native coronary artery without angina pectoris: Secondary | ICD-10-CM

## 2023-12-29 DIAGNOSIS — R0602 Shortness of breath: Secondary | ICD-10-CM | POA: Diagnosis not present

## 2023-12-31 ENCOUNTER — Encounter: Payer: Self-pay | Admitting: Cardiology

## 2023-12-31 ENCOUNTER — Ambulatory Visit: Payer: Self-pay | Admitting: Cardiology

## 2023-12-31 ENCOUNTER — Ambulatory Visit: Attending: Cardiology | Admitting: Cardiology

## 2023-12-31 VITALS — BP 122/74 | HR 66 | Ht 64.0 in | Wt 176.1 lb

## 2023-12-31 DIAGNOSIS — I2510 Atherosclerotic heart disease of native coronary artery without angina pectoris: Secondary | ICD-10-CM | POA: Insufficient documentation

## 2023-12-31 DIAGNOSIS — R0609 Other forms of dyspnea: Secondary | ICD-10-CM | POA: Insufficient documentation

## 2023-12-31 DIAGNOSIS — E781 Pure hyperglyceridemia: Secondary | ICD-10-CM

## 2023-12-31 DIAGNOSIS — I480 Paroxysmal atrial fibrillation: Secondary | ICD-10-CM

## 2023-12-31 DIAGNOSIS — R9439 Abnormal result of other cardiovascular function study: Secondary | ICD-10-CM | POA: Diagnosis not present

## 2023-12-31 DIAGNOSIS — E669 Obesity, unspecified: Secondary | ICD-10-CM

## 2023-12-31 HISTORY — DX: Other forms of dyspnea: R06.09

## 2023-12-31 MED ORDER — NITROGLYCERIN 0.4 MG SL SUBL: 0.4000 mg | SUBLINGUAL_TABLET | SUBLINGUAL | 6 refills | Status: AC | PRN

## 2023-12-31 MED ORDER — ROSUVASTATIN CALCIUM 10 MG PO TABS: 10.0000 mg | ORAL_TABLET | Freq: Every day | ORAL | 3 refills | Status: DC

## 2023-12-31 MED ORDER — ASPIRIN 81 MG PO TBEC: 81.0000 mg | DELAYED_RELEASE_TABLET | Freq: Every day | ORAL | 3 refills | Status: DC

## 2023-12-31 NOTE — Telephone Encounter (Signed)
 Left message to return call.  Abnormal cardiac CT. Needs appt as soon as possible.

## 2023-12-31 NOTE — H&P (View-Only) (Signed)
 Cardiology Office Note:    Date:  12/31/2023   ID:  Catherine Haley, DOB 10-27-1965, MRN [id number]  PCP:  Oris Camie BRAVO, NP  Cardiologist:  Jennifer JONELLE Crape, MD   Referring MD: Oris Camie BRAVO, NP    ASSESSMENT:    1. Abnormal cardiovascular stress test   2. Coronary artery disease involving native coronary artery of native heart, unspecified whether angina present   3. Paroxysmal atrial fibrillation (HCC)   4. Obesity (BMI 30-39.9)   5. Dyspnea on exertion   6. Hypertriglyceridemia    PLAN:    In order of problems listed above:  Coronary artery disease: Dyspnea on exertion: Results of the CT coronary angiography discussed with the patient at length and following recommendations were made to her.  She is advised to take a coated baby aspirin  on a daily basis.  Sublingual nitroglycerin  prescription was sent, its protocol and 911 protocol explained and the patient vocalized understanding questions were answered to the patient's satisfaction.I discussed coronary angiography and left heart catheterization with the patient at extensive length. Procedure, benefits and potential risks were explained. Patient had multiple questions which were answered to the patient's satisfaction. Patient agreed and consented for the procedure. Further recommendations will be made based on the findings of the coronary angiography. In the interim. The patient has any significant symptoms he knows to go to the nearest emergency room. Paroxysmal atrial fibrillation: Stable at this time.  In sinus rhythm.  We will continue to monitor.  No flecainide at this time in view of above findings. Hyperlipidemia: Will do blood work today including Chem-7 and LFTs.  I will initiate her on rosuvastatin  10 mg daily.  She is hesitant on lipid-lowering medications.  Benefits and risks explained and she agrees. Patient will be seen in follow-up appointment in 6 weeks or earlier if the patient has any concerns.    Medication  Adjustments/Labs and Tests Ordered: Current medicines are reviewed at length with the patient today.  Concerns regarding medicines are outlined above.  Orders Placed This Encounter  Procedures   EKG 12-Lead   No orders of the defined types were placed in this encounter.    No chief complaint on file.    History of Present Illness:    Catherine Haley is a 58 y.o. female.  Patient has past medical history of paroxysmal atrial fibrillation and hypertriglyceridemia.  She was evaluated for dyspnea on exertion.  CT coronary angiography revealed significant coronary plaque and obstructive coronary artery disease in the LAD.  She denies any chest pain orthopnea or PND at this time.  She takes care of activities of daily living.  At the time of my evaluation, the patient is alert awake oriented and in no distress.  Past Medical History:  Diagnosis Date   Encounter for annual physical exam 01/22/2022   GERD (gastroesophageal reflux disease) 02/2021   Hepatic steatosis 03/14/2021   By ultrasound for elevated lfts 02/2021   History of cholelithiasis 11/29/2021   Hypertriglyceridemia 02/09/2019   Mild; rec fish oil bid 01/2019   Obesity (BMI 30-39.9) 01/28/2018   Paroxysmal atrial fibrillation (HCC) 09/30/2023   Pre-diabetes 01/24/2023    Past Surgical History:  Procedure Laterality Date   CHOLECYSTECTOMY N/A 06/28/2021   Procedure: LAPAROSCOPIC CHOLECYSTECTOMY WITH INTRAOPERATIVE CHOLANGIOGRAM;  Surgeon: Curvin Deward MOULD, MD;  Location: [facility] OR;  Service: General;  Laterality: N/A;   EYE SURGERY Bilateral    lasix   MANDIBLE FRACTURE SURGERY      Current Medications: Current  Meds  Medication Sig   Ascorbic Acid (VITAMIN C PO) Take 1 tablet by mouth daily.   DHEA 10 MG CAPS Take 10 mg by mouth daily.   Omega-3 Fatty Acids (FISH OIL PO) Take 1 tablet by mouth daily.   VITAMIN D PO Take 1 tablet by mouth daily.     Allergies:   Patient has no known allergies.   Social History    Socioeconomic History   Marital status: Married    Spouse name: Arley   Number of children: 0   Years of education: Not on file   Highest education level: Not on file  Occupational History   Occupation: Doctor, hospital,     Employer: [employer]  Tobacco Use   Smoking status: Never   Smokeless tobacco: Never  Vaping Use   Vaping status: Never Used  Substance and Sexual Activity   Alcohol use: No   Drug use: No   Sexual activity: Not Currently    Birth control/protection: None  Other Topics Concern   Not on file  Social History Narrative   Not on file   Social Drivers of Health   Financial Resource Strain: Low Risk  (01/24/2023)   Overall Financial Resource Strain (CARDIA)    Difficulty of Paying Living Expenses: Not hard at all  Food Insecurity: No Food Insecurity (01/24/2023)   Hunger Vital Sign    Worried About Running Out of Food in the Last Year: Never true    Ran Out of Food in the Last Year: Never true  Transportation Needs: No Transportation Needs (01/24/2023)   PRAPARE - Administrator, Civil Service (Medical): No    Lack of Transportation (Non-Medical): No  Physical Activity: Sufficiently Active (01/24/2023)   Exercise Vital Sign    Days of Exercise per Week: 4 days    Minutes of Exercise per Session: 40 min  Stress: No Stress Concern Present (01/24/2023)   [employer] of Occupational Health - Occupational Stress Questionnaire    Feeling of Stress : Only a little  Social Connections: Socially Integrated (01/24/2023)   Social Connection and Isolation Panel    Frequency of Communication with Friends and Family: More than three times a week    Frequency of Social Gatherings with Friends and Family: Twice a week    Attends Religious Services: 1 to 4 times per year    Active Member of [employer] or Organizations: Yes    Attends Engineer, structural: More than 4 times per year    Marital Status: Married     Family History: The patient's family  history includes Alcohol abuse in her paternal grandfather and paternal grandmother; Arthritis in her father; Breast cancer in her mother; Cancer in her father and mother; Colon cancer (age of onset: 105) in her father; Diverticulitis in her mother; Heart disease in her maternal grandfather, maternal grandmother, paternal grandfather, and paternal grandmother; Heart disease (age of onset: 40) in her father; Heart disease (age of onset: 57) in her mother; Hyperlipidemia in her father; Hypertension in her brother and father.  ROS:   Please see the history of present illness.    All other systems reviewed and are negative.  EKGs/Labs/Other Studies Reviewed:    The following studies were reviewed today: .SABRAEKG Interpretation Date/Time:  Tuesday December 31 2023 14:55:58 EDT Ventricular Rate:  66 PR Interval:  148 QRS Duration:  76 QT Interval:  410 QTC Calculation: 429 R Axis:   82  Text Interpretation: Sinus rhythm with Premature  atrial complexes When compared with ECG of 21-Nov-2023 15:40, Premature atrial complexes are now Present Confirmed by Edwyna Backers [phone]) on 12/31/2023 3:19:55 PM     Recent Labs: No results found for requested labs within last 365 days.  Recent Lipid Panel    Component Value Date/Time   CHOL 183 07/04/2020 1115   TRIG 270.0 (H) 07/04/2020 1115   HDL 33.70 (L) 07/04/2020 1115   CHOLHDL 5 07/04/2020 1115   VLDL 54.0 (H) 07/04/2020 1115   LDLDIRECT 134.0 07/04/2020 1115    Physical Exam:    VS:  BP 122/74   Pulse 66   Ht 5' 4 (1.626 m)   Wt 176 lb 1.3 oz (79.9 kg)   SpO2 96%   BMI 30.22 kg/m     Wt Readings from Last 3 Encounters:  12/31/23 176 lb 1.3 oz (79.9 kg)  11/21/23 179 lb 9.6 oz (81.5 kg)  09/26/23 179 lb (81.2 kg)     GEN: Patient is in no acute distress HEENT: Normal NECK: No JVD; No carotid bruits LYMPHATICS: No lymphadenopathy CARDIAC: Hear sounds regular, 2/6 systolic murmur at the apex. RESPIRATORY:  Clear to auscultation  without rales, wheezing or rhonchi  ABDOMEN: Soft, non-tender, non-distended MUSCULOSKELETAL:  No edema; No deformity  SKIN: Warm and dry NEUROLOGIC:  Alert and oriented x 3 PSYCHIATRIC:  Normal affect   Signed, Backers JONELLE Edwyna, MD  12/31/2023 3:31 PM    [facility]

## 2023-12-31 NOTE — Telephone Encounter (Signed)
-----   Message from Jennifer SAUNDERS Revankar sent at 12/31/2023  8:48 AM EDT ----- Aspirin  and nitroglycerin .  I need to see her soon.  Please make sure she does not stress herself in any way in view of the findings.  Copy primary Jennifer SAUNDERS Crape, MD 12/31/2023 8:47 AM  ----- Message ----- From: Kate Lonni CROME, MD Sent: 12/29/2023   3:52 PM EDT To: Jennifer SAUNDERS Crape, MD   ----- Message ----- From: Interface, Rad Results In Sent: 12/29/2023   3:44 PM EDT To: Lonni CROME Kate, MD

## 2023-12-31 NOTE — Progress Notes (Signed)
 Cardiology Office Note:    Date:  12/31/2023   ID:  Catherine Haley, DOB 10-27-1965, MRN [id number]  PCP:  Oris Camie BRAVO, NP  Cardiologist:  Jennifer JONELLE Crape, MD   Referring MD: Oris Camie BRAVO, NP    ASSESSMENT:    1. Abnormal cardiovascular stress test   2. Coronary artery disease involving native coronary artery of native heart, unspecified whether angina present   3. Paroxysmal atrial fibrillation (HCC)   4. Obesity (BMI 30-39.9)   5. Dyspnea on exertion   6. Hypertriglyceridemia    PLAN:    In order of problems listed above:  Coronary artery disease: Dyspnea on exertion: Results of the CT coronary angiography discussed with the patient at length and following recommendations were made to her.  She is advised to take a coated baby aspirin  on a daily basis.  Sublingual nitroglycerin  prescription was sent, its protocol and 911 protocol explained and the patient vocalized understanding questions were answered to the patient's satisfaction.I discussed coronary angiography and left heart catheterization with the patient at extensive length. Procedure, benefits and potential risks were explained. Patient had multiple questions which were answered to the patient's satisfaction. Patient agreed and consented for the procedure. Further recommendations will be made based on the findings of the coronary angiography. In the interim. The patient has any significant symptoms he knows to go to the nearest emergency room. Paroxysmal atrial fibrillation: Stable at this time.  In sinus rhythm.  We will continue to monitor.  No flecainide at this time in view of above findings. Hyperlipidemia: Will do blood work today including Chem-7 and LFTs.  I will initiate her on rosuvastatin  10 mg daily.  She is hesitant on lipid-lowering medications.  Benefits and risks explained and she agrees. Patient will be seen in follow-up appointment in 6 weeks or earlier if the patient has any concerns.    Medication  Adjustments/Labs and Tests Ordered: Current medicines are reviewed at length with the patient today.  Concerns regarding medicines are outlined above.  Orders Placed This Encounter  Procedures   EKG 12-Lead   No orders of the defined types were placed in this encounter.    No chief complaint on file.    History of Present Illness:    Catherine Haley is a 58 y.o. female.  Patient has past medical history of paroxysmal atrial fibrillation and hypertriglyceridemia.  She was evaluated for dyspnea on exertion.  CT coronary angiography revealed significant coronary plaque and obstructive coronary artery disease in the LAD.  She denies any chest pain orthopnea or PND at this time.  She takes care of activities of daily living.  At the time of my evaluation, the patient is alert awake oriented and in no distress.  Past Medical History:  Diagnosis Date   Encounter for annual physical exam 01/22/2022   GERD (gastroesophageal reflux disease) 02/2021   Hepatic steatosis 03/14/2021   By ultrasound for elevated lfts 02/2021   History of cholelithiasis 11/29/2021   Hypertriglyceridemia 02/09/2019   Mild; rec fish oil bid 01/2019   Obesity (BMI 30-39.9) 01/28/2018   Paroxysmal atrial fibrillation (HCC) 09/30/2023   Pre-diabetes 01/24/2023    Past Surgical History:  Procedure Laterality Date   CHOLECYSTECTOMY N/A 06/28/2021   Procedure: LAPAROSCOPIC CHOLECYSTECTOMY WITH INTRAOPERATIVE CHOLANGIOGRAM;  Surgeon: Curvin Deward MOULD, MD;  Location: [facility] OR;  Service: General;  Laterality: N/A;   EYE SURGERY Bilateral    lasix   MANDIBLE FRACTURE SURGERY      Current Medications: Current  Meds  Medication Sig   Ascorbic Acid (VITAMIN C PO) Take 1 tablet by mouth daily.   DHEA 10 MG CAPS Take 10 mg by mouth daily.   Omega-3 Fatty Acids (FISH OIL PO) Take 1 tablet by mouth daily.   VITAMIN D PO Take 1 tablet by mouth daily.     Allergies:   Patient has no known allergies.   Social History    Socioeconomic History   Marital status: Married    Spouse name: Arley   Number of children: 0   Years of education: Not on file   Highest education level: Not on file  Occupational History   Occupation: Doctor, hospital,     Employer: [employer]  Tobacco Use   Smoking status: Never   Smokeless tobacco: Never  Vaping Use   Vaping status: Never Used  Substance and Sexual Activity   Alcohol use: No   Drug use: No   Sexual activity: Not Currently    Birth control/protection: None  Other Topics Concern   Not on file  Social History Narrative   Not on file   Social Drivers of Health   Financial Resource Strain: Low Risk  (01/24/2023)   Overall Financial Resource Strain (CARDIA)    Difficulty of Paying Living Expenses: Not hard at all  Food Insecurity: No Food Insecurity (01/24/2023)   Hunger Vital Sign    Worried About Running Out of Food in the Last Year: Never true    Ran Out of Food in the Last Year: Never true  Transportation Needs: No Transportation Needs (01/24/2023)   PRAPARE - Administrator, Civil Service (Medical): No    Lack of Transportation (Non-Medical): No  Physical Activity: Sufficiently Active (01/24/2023)   Exercise Vital Sign    Days of Exercise per Week: 4 days    Minutes of Exercise per Session: 40 min  Stress: No Stress Concern Present (01/24/2023)   [employer] of Occupational Health - Occupational Stress Questionnaire    Feeling of Stress : Only a little  Social Connections: Socially Integrated (01/24/2023)   Social Connection and Isolation Panel    Frequency of Communication with Friends and Family: More than three times a week    Frequency of Social Gatherings with Friends and Family: Twice a week    Attends Religious Services: 1 to 4 times per year    Active Member of [employer] or Organizations: Yes    Attends Engineer, structural: More than 4 times per year    Marital Status: Married     Family History: The patient's family  history includes Alcohol abuse in her paternal grandfather and paternal grandmother; Arthritis in her father; Breast cancer in her mother; Cancer in her father and mother; Colon cancer (age of onset: 105) in her father; Diverticulitis in her mother; Heart disease in her maternal grandfather, maternal grandmother, paternal grandfather, and paternal grandmother; Heart disease (age of onset: 40) in her father; Heart disease (age of onset: 57) in her mother; Hyperlipidemia in her father; Hypertension in her brother and father.  ROS:   Please see the history of present illness.    All other systems reviewed and are negative.  EKGs/Labs/Other Studies Reviewed:    The following studies were reviewed today: .SABRAEKG Interpretation Date/Time:  Tuesday December 31 2023 14:55:58 EDT Ventricular Rate:  66 PR Interval:  148 QRS Duration:  76 QT Interval:  410 QTC Calculation: 429 R Axis:   82  Text Interpretation: Sinus rhythm with Premature  atrial complexes When compared with ECG of 21-Nov-2023 15:40, Premature atrial complexes are now Present Confirmed by Edwyna Backers [phone]) on 12/31/2023 3:19:55 PM     Recent Labs: No results found for requested labs within last 365 days.  Recent Lipid Panel    Component Value Date/Time   CHOL 183 07/04/2020 1115   TRIG 270.0 (H) 07/04/2020 1115   HDL 33.70 (L) 07/04/2020 1115   CHOLHDL 5 07/04/2020 1115   VLDL 54.0 (H) 07/04/2020 1115   LDLDIRECT 134.0 07/04/2020 1115    Physical Exam:    VS:  BP 122/74   Pulse 66   Ht 5' 4 (1.626 m)   Wt 176 lb 1.3 oz (79.9 kg)   SpO2 96%   BMI 30.22 kg/m     Wt Readings from Last 3 Encounters:  12/31/23 176 lb 1.3 oz (79.9 kg)  11/21/23 179 lb 9.6 oz (81.5 kg)  09/26/23 179 lb (81.2 kg)     GEN: Patient is in no acute distress HEENT: Normal NECK: No JVD; No carotid bruits LYMPHATICS: No lymphadenopathy CARDIAC: Hear sounds regular, 2/6 systolic murmur at the apex. RESPIRATORY:  Clear to auscultation  without rales, wheezing or rhonchi  ABDOMEN: Soft, non-tender, non-distended MUSCULOSKELETAL:  No edema; No deformity  SKIN: Warm and dry NEUROLOGIC:  Alert and oriented x 3 PSYCHIATRIC:  Normal affect   Signed, Backers JONELLE Edwyna, MD  12/31/2023 3:31 PM    [facility]

## 2023-12-31 NOTE — Patient Instructions (Signed)
 Medication Instructions:  Your physician has recommended you make the following change in your medication:   Take 81 mg coated aspirin  daily.  Use nitroglycerin  1 tablet placed under the tongue at the first sign of chest pain or an angina attack. 1 tablet may be used every 5 minutes as needed, for up to 15 minutes. Do not take more than 3 tablets in 15 minutes. If pain persist call 911 or go to the nearest ED.  Start Atorvastatin 10 mg daily.  *If you need a refill on your cardiac medications before your next appointment, please call your pharmacy*   Lab Work: Your physician recommends that you have a BMET and CBC today in the office for your upcoming procedure.  If you have labs (blood work) drawn today and your tests are completely normal, you will receive your results only by: MyChart Message (if you have MyChart) OR A paper copy in the mail If you have any lab test that is abnormal or we need to change your treatment, we will call you to review the results.   Testing/Procedures:  [facility] DEPT OF [place] [place] [address] [place], [place] [address] [place] [address] Dept: [phone] Loc: [phone]  Catherine Haley  12/31/2023  You are scheduled for a Cardiac Catheterization on Monday, August 11 with Dr. Lonni End.  1. Please arrive at the [facility] (Main Entrance A) at [facility]: [address] [place], [place] [address] at 5:30 AM (This time is 2 hour(s) before your procedure to ensure your preparation).   Free valet parking service is available. You will check in at ADMITTING. The support person will be asked to wait in the waiting room.  It is OK to have someone drop you off and come back when you are ready to be discharged.    Special note: Every effort is made to have your procedure done on time. Please understand that emergencies sometimes delay scheduled procedures.  2.  Diet: No solid foods after midnight. You may have clear liquids until you arrive at the hospital.  List of approved liquids water, clear juice, clear tea, black coffee, fruit juices, non-citric and without pulp, carbonated beverages, Gatorade, Kool -Aid, plain Jello-O and plain ice popsicles.   3. Hydration: You need to be well hydrated before your procedure time. You may drink approved liquids (see below) until you arrive at the hospital. On the way to the hospital, please drink a 16-oz (1 plastic bottle) of water.   List of approved liquids water, clear juice, clear tea, black coffee, fruit juices, non-citric and without pulp, carbonated beverages, Gatorade, Kool -Aid, plain Jello-O and plain ice popsicles.   4. Labs: You had your labs done today in the office.  5. Medication instructions in preparation for your procedure:   Contrast Allergy: No  On the morning of your procedure, take your Aspirin  81 mg and any morning medicines NOT listed above.  You may use sips of water.  6. Plan to go home the same day, you will only stay overnight if medically necessary. 7. Bring a current list of your medications and current insurance cards. 8. You MUST have a responsible person to drive you home. 9. Someone MUST be with you the first 24 hours after you arrive home or your discharge will be delayed. 10. Please wear clothes that are easy to get on and off and wear slip-on shoes.  Thank  you for allowing us  to care for you!   -- [facility]    Follow-Up: At [facility], you and your health needs are our priority.  As part of our continuing mission to provide you with exceptional heart care, we have created designated Provider Care Teams.  These Care Teams include your primary Cardiologist (physician) and Advanced Practice Providers (APPs -  Physician Assistants and Nurse Practitioners) who all work together to provide you with the care you need, when you need  it.  We recommend signing up for the patient portal called MyChart.  Sign up information is provided on this After Visit Summary.  MyChart is used to connect with patients for Virtual Visits (Telemedicine).  Patients are able to view lab/test results, encounter notes, upcoming appointments, etc.  Non-urgent messages can be sent to your provider as well.   To learn more about what you can do with MyChart, go to [email or web address].    Your next appointment:   6 week(s)  The format for your next appointment:   In Person  Provider:   Jennifer Crape, MD   Other Instructions  Coronary Angiogram With Stent Coronary angiogram with stent placement is a procedure to widen or open a narrow blood vessel of the heart (coronary artery). Arteries may become blocked by cholesterol buildup (plaques) in the lining of the artery wall. When a coronary artery becomes partially blocked, blood flow to that area decreases. This may lead to chest pain or a heart attack (myocardial infarction). A stent is a small piece of metal that looks like mesh or spring. Stent placement may be done as treatment after a heart attack, or to prevent a heart attack if a blocked artery is found by a coronary angiogram. Let your health care provider know about: Any allergies you have, including allergies to medicines or contrast dye. All medicines you are taking, including vitamins, herbs, eye drops, creams, and over-the-counter medicines. Any problems you or family members have had with anesthetic medicines. Any blood disorders you have. Any surgeries you have had. Any medical conditions you have, including kidney problems or kidney failure. Whether you are pregnant or may be pregnant. Whether you are breastfeeding. What are the risks? Generally, this is a safe procedure. However, serious problems may occur, including: Damage to nearby structures or organs, such as the heart, blood vessels, or kidneys. A return of  blockage. Bleeding, infection, or bruising at the insertion site. A collection of blood under the skin (hematoma) at the insertion site. A blood clot in another part of the body. Allergic reaction to medicines or dyes. Bleeding into the abdomen (retroperitoneal bleeding). Stroke (rare). Heart attack (rare). What happens before the procedure? Staying hydrated Follow instructions from your health care provider about hydration, which may include: Up to 2 hours before the procedure - you may continue to drink clear liquids, such as water, clear fruit juice, black coffee, and plain tea.    Eating and drinking restrictions Follow instructions from your health care provider about eating and drinking, which may include: 8 hours before the procedure - stop eating heavy meals or foods, such as meat, fried foods, or fatty foods. 6 hours before the procedure - stop eating light meals or foods, such as toast or cereal. 2 hours before the procedure - stop drinking clear liquids. Medicines Ask your health care provider about: Changing or stopping your regular medicines. This is especially important if you are taking diabetes medicines or blood thinners. Taking medicines  such as aspirin  and ibuprofen. These medicines can thin your blood. Do not take these medicines unless your health care provider tells you to take them. Generally, aspirin  is recommended before a thin tube, called a catheter, is passed through a blood vessel and inserted into the heart (cardiac catheterization). Taking over-the-counter medicines, vitamins, herbs, and supplements. General instructions Do not use any products that contain nicotine or tobacco for at least 4 weeks before the procedure. These products include cigarettes, e-cigarettes, and chewing tobacco. If you need help quitting, ask your health care provider. Plan to have someone take you home from the hospital or clinic. If you will be going home right after the procedure,  plan to have someone with you for 24 hours. You may have tests and imaging procedures. Ask your health care provider: How your insertion site will be marked. Ask which artery will be used for the procedure. What steps will be taken to help prevent infection. These may include: Removing hair at the insertion site. Washing skin with a germ-killing soap. Taking antibiotic medicine. What happens during the procedure? An IV will be inserted into one of your veins. Electrodes may be placed on your chest to monitor your heart rate during the procedure. You will be given one or more of the following: A medicine to help you relax (sedative). A medicine to numb the area (local anesthetic) for catheter insertion. A small incision will be made for catheter insertion. The catheter will be inserted into an artery using a guide wire. The location may be in your groin, your wrist, or the fold of your arm (near your elbow). An X-ray procedure (fluoroscopy) will be used to help guide the catheter to the opening of the heart arteries. A dye will be injected into the catheter. X-rays will be taken. The dye helps to show where any narrowing or blockages are located in the arteries. Tell your health care provider if you have chest pain or trouble breathing. A tiny wire will be guided to the blocked spot, and a balloon will be inflated to make the artery wider. The stent will be expanded to crush the plaques into the wall of the vessel. The stent will hold the area open and improve the blood flow. Most stents have a drug coating to reduce the risk of the stent narrowing over time. The artery may be made wider using a drill, laser, or other tools that remove plaques. The catheter will be removed when the blood flow improves. The stent will stay where it was placed, and the lining of the artery will grow over it. A bandage (dressing) will be placed on the insertion site. Pressure will be applied to stop bleeding. The  IV will be removed. This procedure may vary among health care providers and hospitals.    What happens after the procedure? Your blood pressure, heart rate, breathing rate, and blood oxygen level will be monitored until you leave the hospital or clinic. If the procedure is done through the leg, you will lie flat in bed for a few hours or for as long as told by your health care provider. You will be instructed not to bend or cross your legs. The insertion site and the pulse in your foot or wrist will be checked often. You may have more blood tests, X-rays, and a test that records the electrical activity of your heart (electrocardiogram, or ECG). Do not drive for 24 hours if you were given a sedative during your procedure. Summary  Coronary angiogram with stent placement is a procedure to widen or open a narrowed coronary artery. This is done to treat heart problems. Before the procedure, let your health care provider know about all the medical conditions and surgeries you have or have had. This is a safe procedure. However, some problems may occur, including damage to nearby structures or organs, bleeding, blood clots, or allergies. Follow your health care provider's instructions about eating, drinking, medicines, and other lifestyle changes, such as quitting tobacco use before the procedure. This information is not intended to replace advice given to you by your health care provider. Make sure you discuss any questions you have with your health care provider. Document Revised: 12/03/2018 Document Reviewed: 12/03/2018 Elsevier Patient Education  2021 Elsevier Inc.  Aspirin  and Your Heart Aspirin  is a medicine that prevents the platelets in your blood from sticking together. Platelets are the cells that your blood uses for clotting. Aspirin  can be used to help reduce the risk of blood clots, heart attacks, and other heart-related problems. What are the risks? Daily use of aspirin  can cause side  effects. Some of these include: Bleeding. Bleeding can be minor or serious. An example of minor bleeding is bleeding from a cut, and the bleeding does not stop. An example of more serious bleeding is stomach bleeding or, rarely, bleeding into the brain. Your risk of bleeding increases if you are also taking NSAIDs, such as ibuprofen. Increased bruising. Upset stomach. An allergic reaction. People who have growths inside the nose (nasal polyps) have an increased risk of developing an aspirin  allergy. How to use aspirin  to care for your heart Take aspirin  only as told by your health care provider. Make sure that you understand how much to take and what form to take. The two forms of aspirin  are: Non-enteric-coated.This type of aspirin  does not have a coating and is absorbed quickly. This type of aspirin  also comes in a chewable form. Enteric-coated. This type of aspirin  has a coating that releases the medicine very slowly. Enteric-coated aspirin  might cause less stomach upset than non-enteric-coated aspirin . This type of aspirin  should not be chewed or crushed. Work with your health care provider to find out whether it is safe and beneficial for you to take aspirin  daily. Taking aspirin  daily may be helpful if: You have had a heart attack or chest pain, or you are at risk for a heart attack. You have a condition in which certain heart vessels are blocked (coronary artery disease), and you have had a procedure to treat it. Examples are: Open-heart surgery, such as coronary artery bypass surgery (CABG). Coronary angioplasty,which is done to widen a blood vessel of your heart. Having a small mesh tube, or stent, placed in your coronary artery. You have had certain types of stroke or a mini-stroke known as a transient ischemic attack (TIA). You have a narrowing of the arteries that supply the limbs (peripheral artery disease, or PAD). You have long-term (chronic) heart rhythm problems, such as atrial  fibrillation, and your health care provider thinks aspirin  may help. You have valve disease or have had surgery on a valve. You are considered at increased risk of developing coronary artery disease or PAD.    Follow these instructions at home Medicines Take over-the-counter and prescription medicines only as told by your health care provider. If you are taking blood thinners: Talk with your health care provider before you take any medicines that contain aspirin  or NSAIDs, such as ibuprofen. These medicines increase your  risk for dangerous bleeding. Take your medicine exactly as told, at the same time every day. Avoid activities that could cause injury or bruising, and follow instructions about how to prevent falls. Wear a medical alert bracelet or carry a card that lists what medicines you take. General instructions Do not drink alcohol if: Your health care provider tells you not to drink. You are pregnant, may be pregnant, or are planning to become pregnant. If you drink alcohol: Limit how much you use to: 0-1 drink a day for women. 0-2 drinks a day for men. Be aware of how much alcohol is in your drink. In the [place] one drink equals one 12 oz bottle of beer (355 mL), one 5 oz glass of wine (148 mL), or one 1 oz glass of hard liquor (44 mL). Keep all follow-up visits as told by your health care provider. This is important. Where to find more information The American Heart Association: www.heart.org Contact a health care provider if you have: Unusual bleeding or bruising. Stomach pain or nausea. Ringing in your ears. An allergic reaction that causes hives, itchy skin, or swelling of the lips, tongue, or face. Get help right away if: You notice that your bowel movements are bloody, or dark red or black in color. You vomit or cough up blood. You have blood in your urine. You cough, breathe loudly (wheeze), or feel short of breath. You have chest pain, especially if the pain spreads  to your arms, back, neck, or jaw. You have a headache with confusion. You have any symptoms of a stroke. BE FAST is an easy way to remember the main warning signs of a stroke: B - Balance. Signs are dizziness, sudden trouble walking, or loss of balance. E - Eyes. Signs are trouble seeing or a sudden change in vision. F - Face. Signs are sudden weakness or numbness of the face, or the face or eyelid drooping on one side. A - Arms. Signs are weakness or numbness in an arm. This happens suddenly and usually on one side of the body. S - Speech. Signs are sudden trouble speaking, slurred speech, or trouble understanding what people say. T - Time. Time to call emergency services. Write down what time symptoms started. You have other signs of a stroke, such as: A sudden, severe headache with no known cause. Nausea or vomiting. Seizure. These symptoms may represent a serious problem that is an emergency. Do not wait to see if the symptoms will go away. Get medical help right away. Call your local emergency services (911 in the [place] Do not drive yourself to the hospital. Summary Aspirin  use can help reduce the risk of blood clots, heart attacks, and other heart-related problems. Daily use of aspirin  can cause side effects. Take aspirin  only as told by your health care provider. Make sure that you understand how much to take and what form to take. Your health care provider will help you determine whether it is safe and beneficial for you to take aspirin  daily. This information is not intended to replace advice given to you by your health care provider. Make sure you discuss any questions you have with your health care provider. Document Revised: 02/16/2019 Document Reviewed: 02/16/2019 Elsevier Patient Education  2021 Elsevier Inc. Nitroglycerin  sublingual tablets What is this medicine? NITROGLYCERIN  (nye troe GLI ser in) is a type of vasodilator. It relaxes blood vessels, increasing the blood and  oxygen supply to your heart. This medicine is used to relieve chest pain  caused by angina. It is also used to prevent chest pain before activities like climbing stairs, going outdoors in cold weather, or sexual activity. This medicine may be used for other purposes; ask your health care provider or pharmacist if you have questions. COMMON BRAND NAME(S): Nitroquick, Nitrostat , Nitrotab What should I tell my health care provider before I take this medicine? They need to know if you have any of these conditions: anemia head injury, recent stroke, or bleeding in the brain liver disease previous heart attack an unusual or allergic reaction to nitroglycerin , other medicines, foods, dyes, or preservatives pregnant or trying to get pregnant breast-feeding How should I use this medicine? Take this medicine by mouth as needed. Use at the first sign of an angina attack (chest pain or tightness). You can also take this medicine 5 to 10 minutes before an event likely to produce chest pain. Follow the directions exactly as written on the prescription label. Place one tablet under your tongue and let it dissolve. Do not swallow whole. Replace the dose if you accidentally swallow it. It will help if your mouth is not dry. Saliva around the tablet will help it to dissolve more quickly. Do not eat or drink, smoke or chew tobacco while a tablet is dissolving. Sit down when taking this medicine. In an angina attack, you should feel better within 5 minutes after your first dose. You can take a dose every 5 minutes up to a total of 3 doses. If you do not feel better or feel worse after 1 dose, call 9-1-1 at once. Do not take more than 3 doses in 15 minutes. Your health care provider might give you other directions. Follow those directions if he or she does. Do not take your medicine more often than directed. Talk to your health care provider about the use of this medicine in children. Special care may be  needed. Overdosage: If you think you have taken too much of this medicine contact a poison control center or emergency room at once. NOTE: This medicine is only for you. Do not share this medicine with others. What if I miss a dose? This does not apply. This medicine is only used as needed. What may interact with this medicine? Do not take this medicine with any of the following medications: certain migraine medicines like ergotamine and dihydroergotamine (DHE) medicines used to treat erectile dysfunction like sildenafil, tadalafil, and vardenafil riociguat This medicine may also interact with the following medications: alteplase aspirin  heparin medicines for high blood pressure medicines for mental depression other medicines used to treat angina phenothiazines like chlorpromazine, mesoridazine, prochlorperazine, thioridazine This list may not describe all possible interactions. Give your health care provider a list of all the medicines, herbs, non-prescription drugs, or dietary supplements you use. Also tell them if you smoke, drink alcohol, or use illegal drugs. Some items may interact with your medicine. What should I watch for while using this medicine? Tell your doctor or health care professional if you feel your medicine is no longer working. Keep this medicine with you at all times. Sit or lie down when you take your medicine to prevent falling if you feel dizzy or faint after using it. Try to remain calm. This will help you to feel better faster. If you feel dizzy, take several deep breaths and lie down with your feet propped up, or bend forward with your head resting between your knees. You may get drowsy or dizzy. Do not drive, use machinery, or do  anything that needs mental alertness until you know how this drug affects you. Do not stand or sit up quickly, especially if you are an older patient. This reduces the risk of dizzy or fainting spells. Alcohol can make you more drowsy and  dizzy. Avoid alcoholic drinks. Do not treat yourself for coughs, colds, or pain while you are taking this medicine without asking your doctor or health care professional for advice. Some ingredients may increase your blood pressure. What side effects may I notice from receiving this medicine? Side effects that you should report to your doctor or health care professional as soon as possible: allergic reactions (skin rash, itching or hives; swelling of the face, lips, or tongue) low blood pressure (dizziness; feeling faint or lightheaded, falls; unusually weak or tired) low red blood cell counts (trouble breathing; feeling faint; lightheaded, falls; unusually weak or tired) Side effects that usually do not require medical attention (report to your doctor or health care professional if they continue or are bothersome): facial flushing (redness) headache nausea, vomiting This list may not describe all possible side effects. Call your doctor for medical advice about side effects. You may report side effects to FDA at 1-800-FDA-1088. Where should I keep my medicine? Keep out of the reach of children. Store at room temperature between 20 and 25 degrees C (68 and 77 degrees F). Store in Retail buyer. Protect from light and moisture. Keep tightly closed. Throw away any unused medicine after the expiration date. NOTE: This sheet is a summary. It may not cover all possible information. If you have questions about this medicine, talk to your doctor, pharmacist, or health care provider.  2021 Elsevier/Gold Standard (2018-02-12 16:46:32)

## 2024-01-01 ENCOUNTER — Ambulatory Visit: Payer: Self-pay | Admitting: Cardiology

## 2024-01-01 LAB — BASIC METABOLIC PANEL WITH GFR
BUN/Creatinine Ratio: 14 (ref 9–23)
BUN: 11 mg/dL (ref 6–24)
CO2: 24 mmol/L (ref 20–29)
Calcium: 10.4 mg/dL — ABNORMAL HIGH (ref 8.7–10.2)
Chloride: 100 mmol/L (ref 96–106)
Creatinine, Ser: 0.8 mg/dL (ref 0.57–1.00)
Glucose: 92 mg/dL (ref 70–99)
Potassium: 4.9 mmol/L (ref 3.5–5.2)
Sodium: 141 mmol/L (ref 134–144)
eGFR: 85 mL/min/1.73 (ref >59)

## 2024-01-01 LAB — CBC WITH DIFFERENTIAL/PLATELET
Basophils Absolute: 0 x10E3/uL (ref 0.0–0.2)
Basos: 0 %
EOS (ABSOLUTE): 0.2 x10E3/uL (ref 0.0–0.4)
Eos: 2 %
Hematocrit: 45.6 % (ref 34.0–46.6)
Hemoglobin: 14.8 g/dL (ref 11.1–15.9)
Immature Grans (Abs): 0 x10E3/uL (ref 0.0–0.1)
Immature Granulocytes: 0 %
Lymphocytes Absolute: 2.3 x10E3/uL (ref 0.7–3.1)
Lymphs: 25 %
MCH: 30.1 pg (ref 26.6–33.0)
MCHC: 32.5 g/dL (ref 31.5–35.7)
MCV: 93 fL (ref 79–97)
Monocytes Absolute: 0.8 x10E3/uL (ref 0.1–0.9)
Monocytes: 9 %
Neutrophils Absolute: 5.7 x10E3/uL (ref 1.4–7.0)
Neutrophils: 64 %
Platelets: 310 x10E3/uL (ref 150–450)
RBC: 4.91 x10E6/uL (ref 3.77–5.28)
RDW: 12.5 % (ref 11.7–15.4)
WBC: 9 x10E3/uL (ref 3.4–10.8)

## 2024-01-02 ENCOUNTER — Telehealth: Payer: Self-pay | Admitting: *Deleted

## 2024-01-02 NOTE — Telephone Encounter (Addendum)
 Cardiac Catheterization scheduled at [facility] for: Monday January 06, 2024 7:30 AM Arrival time [facility] Main Entrance A at: 5:30 AM  Diet: -Nothing to eat after midnight prior to procedure.  Hydration: -May drink clear liquids until leaving for hospital. Approved liquids: Water, clear tea, black coffee, fruit juices-non-citric and without pulp,Gatorade, plain Jello/popsicles.  Drink 16 oz. bottle of water on the way to the hospital.  Medication instructions: -Usual morning medications can be taken including aspirin  81 mg.  Plan to go home the same day, you will only stay overnight if medically necessary.  You must have responsible adult to drive you home.  Someone must be with you the first 24 hours after you arrive home.  Reviewed procedure instructions with patient.

## 2024-01-06 ENCOUNTER — Ambulatory Visit (HOSPITAL_COMMUNITY)
Admission: RE | Admit: 2024-01-06 | Discharge: 2024-01-06 | Disposition: A | Discharge status: Home / Self Care | Attending: Internal Medicine | Admitting: Internal Medicine

## 2024-01-06 ENCOUNTER — Other Ambulatory Visit (HOSPITAL_COMMUNITY): Payer: Self-pay

## 2024-01-06 ENCOUNTER — Encounter (HOSPITAL_COMMUNITY)
Admission: RE | Disposition: A | Discharge status: Home / Self Care | Payer: Self-pay | Source: Home / Self Care | Attending: Internal Medicine

## 2024-01-06 DIAGNOSIS — Z6830 Body mass index (BMI) 30.0-30.9, adult: Secondary | ICD-10-CM | POA: Insufficient documentation

## 2024-01-06 DIAGNOSIS — E669 Obesity, unspecified: Secondary | ICD-10-CM | POA: Insufficient documentation

## 2024-01-06 DIAGNOSIS — Z79899 Other long term (current) drug therapy: Secondary | ICD-10-CM | POA: Diagnosis not present

## 2024-01-06 DIAGNOSIS — R931 Abnormal findings on diagnostic imaging of heart and coronary circulation: Secondary | ICD-10-CM

## 2024-01-06 DIAGNOSIS — E781 Pure hyperglyceridemia: Secondary | ICD-10-CM | POA: Diagnosis not present

## 2024-01-06 DIAGNOSIS — R0609 Other forms of dyspnea: Secondary | ICD-10-CM | POA: Diagnosis not present

## 2024-01-06 DIAGNOSIS — I480 Paroxysmal atrial fibrillation: Secondary | ICD-10-CM | POA: Insufficient documentation

## 2024-01-06 DIAGNOSIS — Z955 Presence of coronary angioplasty implant and graft: Secondary | ICD-10-CM

## 2024-01-06 DIAGNOSIS — I2510 Atherosclerotic heart disease of native coronary artery without angina pectoris: Secondary | ICD-10-CM | POA: Diagnosis present

## 2024-01-06 DIAGNOSIS — R9439 Abnormal result of other cardiovascular function study: Secondary | ICD-10-CM

## 2024-01-06 HISTORY — PX: CORONARY STENT INTERVENTION: CATH118234

## 2024-01-06 HISTORY — PX: LEFT HEART CATH AND CORONARY ANGIOGRAPHY: CATH118249

## 2024-01-06 LAB — POCT ACTIVATED CLOTTING TIME
Activated Clotting Time: 256 s
Activated Clotting Time: 262 s

## 2024-01-06 LAB — GLUCOSE, CAPILLARY: Glucose-Capillary: 103 mg/dL — ABNORMAL HIGH (ref 70–99)

## 2024-01-06 SURGERY — LEFT HEART CATH AND CORONARY ANGIOGRAPHY
Anesthesia: LOCAL

## 2024-01-06 MED ORDER — SODIUM CHLORIDE 0.9% FLUSH: 3.0000 mL | Freq: Two times a day (BID) | INTRAVENOUS | Status: DC

## 2024-01-06 MED ORDER — IOHEXOL 350 MG/ML SOLN
INTRAVENOUS | Status: DC | PRN
  Administered 2024-01-06 (×2): 80 mL

## 2024-01-06 MED ORDER — SODIUM CHLORIDE 0.9% FLUSH: 3.0000 mL | INTRAVENOUS | Status: DC | PRN

## 2024-01-06 MED ORDER — HEPARIN SODIUM (PORCINE) 1000 UNIT/ML IJ SOLN
INTRAMUSCULAR | Status: DC | PRN
  Administered 2024-01-06: 4000 [IU] via INTRAVENOUS
  Administered 2024-01-06: 2000 [IU] via INTRAVENOUS
  Administered 2024-01-06 (×2): 4000 [IU] via INTRAVENOUS
  Administered 2024-01-06: 2000 [IU] via INTRAVENOUS
  Administered 2024-01-06: 4000 [IU] via INTRAVENOUS
  Administered 2024-01-06 (×2): 2500 [IU] via INTRAVENOUS

## 2024-01-06 MED ORDER — FREE WATER: 500.0000 mL | Freq: Once | Status: DC

## 2024-01-06 MED ORDER — LIDOCAINE HCL (PF) 1 % IJ SOLN
INTRAMUSCULAR | Status: DC | PRN
  Administered 2024-01-06 (×2): 2 mL

## 2024-01-06 MED ORDER — HYDRALAZINE HCL 20 MG/ML IJ SOLN
10.0000 mg | INTRAMUSCULAR | Status: AC | PRN
Start: 2024-01-06 — End: 2024-01-06

## 2024-01-06 MED ORDER — MIDAZOLAM HCL 2 MG/2ML IJ SOLN
INTRAMUSCULAR | Status: DC | PRN
  Administered 2024-01-06 (×4): 1 mg via INTRAVENOUS

## 2024-01-06 MED ORDER — SODIUM CHLORIDE 0.9 % IV SOLN
250.0000 mL | INTRAVENOUS | Status: DC | PRN
Start: 2024-01-06 — End: 2024-01-06

## 2024-01-06 MED ORDER — HEPARIN SODIUM (PORCINE) 1000 UNIT/ML IJ SOLN
INTRAMUSCULAR | Status: AC
  Filled 2024-01-06: qty 10

## 2024-01-06 MED ORDER — CLOPIDOGREL BISULFATE 300 MG PO TABS
ORAL_TABLET | ORAL | Status: DC | PRN
  Administered 2024-01-06 (×2): 600 mg via ORAL

## 2024-01-06 MED ORDER — CLOPIDOGREL BISULFATE 75 MG PO TABS
75.0000 mg | ORAL_TABLET | Freq: Every day | ORAL | 0 refills | Status: DC
  Filled 2024-01-06 (×2): qty 30, 30d supply, fill #0

## 2024-01-06 MED ORDER — VERAPAMIL HCL 2.5 MG/ML IV SOLN
INTRAVENOUS | Status: DC | PRN
  Administered 2024-01-06 (×2): 10 mL via INTRA_ARTERIAL

## 2024-01-06 MED ORDER — ONDANSETRON HCL 4 MG/2ML IJ SOLN: 4.0000 mg | Freq: Four times a day (QID) | INTRAMUSCULAR | Status: DC | PRN

## 2024-01-06 MED ORDER — SODIUM CHLORIDE 0.9 % IV SOLN: 250.0000 mL | INTRAVENOUS | Status: DC | PRN

## 2024-01-06 MED ORDER — ASPIRIN 81 MG PO CHEW: 81.0000 mg | CHEWABLE_TABLET | Freq: Every day | ORAL | Status: DC

## 2024-01-06 MED ORDER — NITROGLYCERIN 1 MG/10 ML FOR IR/CATH LAB
INTRA_ARTERIAL | Status: DC | PRN
  Administered 2024-01-06 (×4): 200 ug via INTRACORONARY

## 2024-01-06 MED ORDER — NITROGLYCERIN 1 MG/10 ML FOR IR/CATH LAB
INTRA_ARTERIAL | Status: AC
  Filled 2024-01-06: qty 10

## 2024-01-06 MED ORDER — ROSUVASTATIN CALCIUM 10 MG PO TABS: 20.0000 mg | ORAL_TABLET | Freq: Every day | ORAL | Status: AC

## 2024-01-06 MED ORDER — CLOPIDOGREL BISULFATE 75 MG PO TABS: 75.0000 mg | ORAL_TABLET | Freq: Every day | ORAL | Status: DC

## 2024-01-06 MED ORDER — FENTANYL CITRATE (PF) 100 MCG/2ML IJ SOLN
INTRAMUSCULAR | Status: DC | PRN
  Administered 2024-01-06 (×4): 25 ug via INTRAVENOUS

## 2024-01-06 MED ORDER — ACETAMINOPHEN 325 MG PO TABS
650.0000 mg | ORAL_TABLET | ORAL | Status: DC | PRN
Start: 2024-01-06 — End: 2024-01-06

## 2024-01-06 MED ORDER — MIDAZOLAM HCL 2 MG/2ML IJ SOLN
INTRAMUSCULAR | Status: AC
Start: 2024-01-06 — End: 2024-01-06
  Filled 2024-01-06: qty 2

## 2024-01-06 MED ORDER — ASPIRIN 81 MG PO CHEW: 81.0000 mg | CHEWABLE_TABLET | ORAL | Status: DC

## 2024-01-06 MED ORDER — VERAPAMIL HCL 2.5 MG/ML IV SOLN
INTRAVENOUS | Status: AC
  Filled 2024-01-06: qty 2

## 2024-01-06 MED ORDER — SODIUM CHLORIDE 0.9% FLUSH
3.0000 mL | INTRAVENOUS | Status: DC | PRN
Start: 2024-01-06 — End: 2024-01-06

## 2024-01-06 MED ORDER — FENTANYL CITRATE (PF) 100 MCG/2ML IJ SOLN
INTRAMUSCULAR | Status: AC
  Filled 2024-01-06: qty 2

## 2024-01-06 MED ORDER — LABETALOL HCL 5 MG/ML IV SOLN
10.0000 mg | INTRAVENOUS | Status: AC | PRN
Start: 2024-01-06 — End: 2024-01-06

## 2024-01-06 MED ORDER — HEPARIN (PORCINE) IN NACL 1000-0.9 UT/500ML-% IV SOLN
INTRAVENOUS | Status: DC | PRN
  Administered 2024-01-06 (×6): 500 mL

## 2024-01-06 MED ORDER — CLOPIDOGREL BISULFATE 300 MG PO TABS
ORAL_TABLET | ORAL | Status: AC
  Filled 2024-01-06: qty 2

## 2024-01-06 MED ORDER — SODIUM CHLORIDE 0.9 % IV SOLN: INTRAVENOUS | Status: AC

## 2024-01-06 SURGICAL SUPPLY — 14 items
BALLOON SAPPHIRE 2.0X15 (BALLOONS) IMPLANT
BALLOON SAPPHIRE NC24 2.75X15 (BALLOONS) IMPLANT
CATH 5FR JL3.5 JR4 ANG PIG MP (CATHETERS) IMPLANT
CATH INFINITI 5 FR 3DRC (CATHETERS) IMPLANT
CATH LAUNCHER 6FR EBU3.5 (CATHETERS) IMPLANT
DEVICE RAD COMP TR BAND LRG (VASCULAR PRODUCTS) IMPLANT
GLIDESHEATH SLEND SS 6F .021 (SHEATH) IMPLANT
GUIDEWIRE INQWIRE 1.5J.035X260 (WIRE) IMPLANT
KIT ENCORE 26 ADVANTAGE (KITS) IMPLANT
PACK CARDIAC CATHETERIZATION (CUSTOM PROCEDURE TRAY) ×1 IMPLANT
SET ATX-X65L (MISCELLANEOUS) IMPLANT
STENT SYNERGY XD 2.50X32 (Permanent Stent) IMPLANT
WIRE HI TORQ BMW 190CM (WIRE) IMPLANT
WIRE RUNTHROUGH .014X180CM (WIRE) IMPLANT

## 2024-01-06 NOTE — Interval H&P Note (Signed)
 History and Physical Interval Note:  01/06/2024 7:08 AM  Catherine Haley  has presented today for surgery, with the diagnosis of abnormal cardiac CTA.  The various methods of treatment have been discussed with the patient and family. After consideration of risks, benefits and other options for treatment, the patient has consented to  Procedure(s): LEFT HEART CATH AND CORONARY ANGIOGRAPHY (N/A) as a surgical intervention.  The patient's history has been reviewed, patient examined, no change in status, stable for surgery.  I have reviewed the patient's chart and labs.  Questions were answered to the patient's satisfaction.    Cath Lab Visit (complete for each Cath Lab visit)  Clinical Evaluation Leading to the Procedure:   ACS: No.  Non-ACS:    Anginal Classification: CCS I  Anti-ischemic medical therapy: No Therapy  Non-Invasive Test Results: Indeterminate stress echo; coronary CTA with significant mid LAD stenosis by CT-FFR  Prior CABG: No previous CABG  [name]

## 2024-01-06 NOTE — Discharge Summary (Signed)
 Discharge Summary for Same Day PCI   Patient ID: Catherine Haley MRN: [id number]; DOB: March 27, 1966  Admit date: 01/06/2024 Discharge date: 01/06/2024  Primary Care Provider: Oris Camie BRAVO, NP  Primary Cardiologist: Jennifer JONELLE Crape, MD  Primary Electrophysiologist:  None   Discharge Diagnoses    Active Problems:   Coronary artery disease   Diagnostic Studies/Procedures    Cardiac Catheterization 01/06/2024:  Conclusions: Significant two-vessel coronary artery disease with sequential 80-90% mid LAD stenoses as well as 80-90% OM1 and 60-70% mid LCx disease. Normal left ventricular systolic function (LVEF 55-65%) with upper normal filling pressure (LVEDP 15 mmHg). Successful PCI to mid LAD using Synergy XD 2.5 x 32 mm drug-eluting stent with 0% residual stenosis and TIMI-3 flow.   Recommendations: Dual antiplatelet therapy with aspirin  and clopidogrel  for at least 6 months. Aggressive secondary prevention of coronary artery disease; favor medical management of OM/LCx disease unless the patient develops refractory angina. Anticipate same-day PCI in 6 hours if no post-PCI complications occur.   Catherine Hanson, MD [facility]  Diagnostic Dominance: Right  Intervention   _____________   History of Present Illness     Catherine Haley is a 58 y.o. female with past medical history of paroxysmal atrial fibrillation and hypertriglyceridemia who was referred to cardiology for dyspnea on exertion.  Underwent coronary CT angio which showed significant plaque and obstructive coronary artery disease in the LAD.  She was seen back in the office on 8/5 to review findings with recommendations given for outpatient cardiac catheterization.  Started on Crestor  10 mg daily.  Set up for outpatient cardiac catheterization.  Hospital Course     The patient underwent cardiac cath as noted above with significant two-vessel CAD with sequential 80 to 90% mid LAD stenosis as well as 80 to 90% OM1 in 60  to 70% mid circumflex disease.  Underwent successful PCI to mid LAD with DES x 1. Plan for DAPT with ASA/Plavix  for at least 6 months. The patient was seen by cardiac rehab while in short stay. There were no observed complications post cath. Radial cath site was re-evaluated prior to discharge and found to be stable without any complications. Instructions/precautions regarding cath site care were given prior to discharge.  Catherine Haley was seen by Dr. Hanson and determined stable for discharge home. Follow up with our office has been arranged. Medications are listed below. Pertinent changes include addition of Plavix  and increased Crestor  to 20 mg daily along with sublingual nitroglycerin ..  _____________  Cath/PCI Registry Performance & Quality Measures: Aspirin  prescribed? - Yes ADP Receptor Inhibitor (Plavix /Clopidogrel , Brilinta/Ticagrelor or Effient/Prasugrel) prescribed (includes medically managed patients)? - Yes High Intensity Statin (Lipitor 40-80mg  or Crestor  20-40mg ) prescribed? - Yes For EF <40%, was ACEI/ARB prescribed? - Not Applicable (EF >/= 40%) For EF <40%, Aldosterone Antagonist (Spironolactone or Eplerenone) prescribed? - Not Applicable (EF >/= 40%) Cardiac Rehab Phase II ordered (Included Medically managed Patients)? - Yes _____________   Discharge Vitals Blood pressure 119/69, pulse 67, temperature 98.6 F (37 C), temperature source Oral, resp. rate 14, height 5' 4 (1.626 m), weight 78 kg, SpO2 96%.  Filed Weights   01/06/24 0609  Weight: 78 kg    Last Labs & Radiologic Studies    CBC No results for input(s): WBC, NEUTROABS, HGB, HCT, MCV, PLT in the last 72 hours. Basic Metabolic Panel No results for input(s): NA, K, CL, CO2, GLUCOSE, BUN, CREATININE, CALCIUM , MG, PHOS in the last 72 hours. Liver Function Tests No results for input(s): AST,  ALT, ALKPHOS, BILITOT, PROT, ALBUMIN in the last 72 hours. No results for  input(s): LIPASE, AMYLASE in the last 72 hours. High Sensitivity Troponin:   No results for input(s): TROPONINIHS in the last 720 hours.  BNP Invalid input(s): POCBNP D-Dimer No results for input(s): DDIMER in the last 72 hours. Hemoglobin A1C No results for input(s): HGBA1C in the last 72 hours. Fasting Lipid Panel No results for input(s): CHOL, HDL, LDLCALC, TRIG, CHOLHDL, LDLDIRECT in the last 72 hours. Thyroid Function Tests No results for input(s): TSH, T4TOTAL, T3FREE, THYROIDAB in the last 72 hours.  Invalid input(s): FREET3 _____________  CARDIAC CATHETERIZATION Result Date: 01/06/2024 Conclusions: Significant two-vessel coronary artery disease with sequential 80-90% mid LAD stenoses as well as 80-90% OM1 and 60-70% mid LCx disease. Normal left ventricular systolic function (LVEF 55-65%) with upper normal filling pressure (LVEDP 15 mmHg). Successful PCI to mid LAD using Synergy XD 2.5 x 32 mm drug-eluting stent with 0% residual stenosis and TIMI-3 flow.  Recommendations: Dual antiplatelet therapy with aspirin  and clopidogrel  for at least 6 months. Aggressive secondary prevention of coronary artery disease; favor medical management of OM/LCx disease unless the patient develops refractory angina. Anticipate same-day PCI in 6 hours if no post-PCI complications occur. Catherine Hanson, MD [facility]  CT CORONARY [facility] W/CTA COR W/SCORE DEL W/CM &/OR WO/CM Addendum Date: 12/31/2023 ADDENDUM REPORT: 12/31/2023 15:11 ADDENDUM: The following report is an over-read performed by radiologist Dr. Reyes Holder of [facility] on 12/31/2023. This over-read does not include interpretation of cardiac or coronary anatomy or pathology. The coronary calcium  score/coronary CTA interpretation by the cardiologist is attached. COMPARISON:  None. FINDINGS: Vascular: No acute non-cardiac vascular finding. Mediastinum/Nodes: Gas fluid levels in a patulous  esophagus. Prominent/mildly enlarged mediastinal lymph nodes and hilar nodal tissue for instance a right paratracheal lymph node measuring 9 mm in short axis on image 9/308 and a right hilar lymph node measuring 10 mm in short axis on image 87/308. Lungs/Pleura: Scattered pulmonary nodules for instance in the left lower lobe measuring 5 mm on image 81/309 and in the right lower lobe measuring 7 mm on image 121/309. Upper Abdomen: Visualized portions of the upper abdomen are unremarkable. Musculoskeletal: There are no aggressive appearing lytic or blastic lesions noted in the visualized portions of the skeleton. IMPRESSION: 1. Scattered pulmonary nodules measuring up to 7 mm in the right lower lobe. Suggest further evaluation by dedicated chest CT in 1-2 months. 2. Prominent/mildly enlarged mediastinal lymph nodes and hilar nodal tissue, nonspecific but possibly reactive. Suggest attention on follow-up imaging. 3. Gas fluid levels in a patulous esophagus, suggestive of gastroesophageal reflux. Electronically Signed   By: Reyes Holder M.D.   On: 12/31/2023 15:11   Result Date: 12/31/2023 CLINICAL DATA:  Chest pain EXAM: Cardiac/Coronary CTA TECHNIQUE: A non-contrast, gated CT scan was obtained with axial slices of 2.5 mm through the heart for calcium  scoring. Calcium  scoring was performed using the Agatston method. A 120 kV prospective, gated, contrast cardiac CT scan was obtained. Gantry rotation speed was 230 msec and collimation was 0.63 mm. Two sublingual nitroglycerin  tablets (0.8 mg) were given. The 3D data set was reconstructed with motion correction for the best systolic or diastolic phase. Images were analyzed on a dedicated workstation using MPR, MIP, and VRT modes. The patient received 95 cc of contrast. FINDINGS: Image quality: Excellent. Noise artifact is: Limited. Coronary Arteries:  Normal coronary origin.  Right dominance. Left main: The left main is a large caliber vessel with a  normal take off  from the left coronary cusp that bifurcates to form a left anterior descending artery and a left circumflex artery. Calcified plaque in left main causes 0-24% stenosis Left anterior descending artery: The LAD is patent. The LAD gives off 3 patent diagonal branches. Mixed plaque in proximal LAD causes 25-49% stenosis. Mixed plaque in mid LAD causes 70-99% stenosis Left circumflex artery: The LCX is non-dominant and patent. The LCX gives off 2 patent obtuse marginal branches. Calcified plaque in proximal LCX causes 0-25% stenosis. Mixed plaque in mid LCX causes 50-69% stenosis. Noncalcified plaque in OM1 causes 25-49% stenosis Right coronary artery: The RCA is dominant with normal take off from the right coronary cusp. The RCA terminates as a PDA and right posterolateral branch. Calcified plaque in proximal RCA causes 25-49% stenosis. Calcified plaque in mid RCA causes 25-49% stenosis Right Atrium: Right atrial size is within normal limits. Right Ventricle: The right ventricular cavity is within normal limits. Left Atrium: Left atrial size is normal in size with no left atrial appendage filling defect. Left Ventricle: The ventricular cavity size is within normal limits. Pulmonary arteries: Normal in size. Pulmonary veins: Normal pulmonary venous drainage. Pericardium: Normal thickness without significant effusion or calcium  present. Cardiac valves: The aortic valve is trileaflet without significant calcification. The mitral valve is normal without significant calcification. Aorta: Normal caliber without significant disease. Extra-cardiac findings: See attached radiology report for non-cardiac structures. IMPRESSION: 1. Coronary calcium  score of 429. This was 98th percentile for age-, sex, and race-matched controls. 2. Total plaque volume 669mm3 which is 93rd percentile for age- and sex-matched controls (calcified plaque 168mm3; non-calcified plaque 53mm3). TPV is severe 3.  Normal coronary origin with right  dominance. 4.  Severe (70-99%) stenosis in mid LAD 5.  Moderate (50-69%) stenosis in mid LCX 6. Mild (25-49%) stenosis in proximal LAD, proximal OM1, and proximal/mid RCA 7.  Minimal (0-24%) stenosis in left main and proximal LCX 8.  Will send study for CTFFR RECOMMENDATIONS: CAD-RADS 4: Severe stenosis. (70-99% or > 50% left main). Cardiac catheterization or CT FFR is recommended. Consider symptom-guided anti-ischemic pharmacotherapy as well as risk factor modification per guideline directed care. Invasive coronary angiography recommended with revascularization per published guideline statements. Electronically Signed: By: Catherine Nanas M.D. On: 12/29/2023 15:37   CT CORONARY FRACTIONAL FLOW RESERVE FLUID ANALYSIS Result Date: 12/29/2023 EXAM: FFRCT ANALYSIS FINDINGS: FFRct analysis was performed on the original cardiac CT angiogram dataset. Diagrammatic representation of the FFRct analysis is provided in a separate PDF document in PACS. This dictation was created using the PDF document and an interactive 3D model of the results. 3D model is not available in the EMR/PACS. Normal FFR range is >0.80. 1. Left Main: No significant stenosis 2. LAD: CTFFR 0.93 across lesion in proximal LAD, suggesting lesion is not functionally significant. CTFFR drops to 0.56 across lesion in mid LAD, suggesting lesion is functionally significant. 3. LCX: CTFFR 0.93 across lesion in proximal LCX and drops to 0.83 across lesion in mid LCX and 0.88 across lesion in OM1, suggesting none of these lesions are functionally significant 4. RCA: CTFFR 0.86 across lesion in proximal to mid RCA, suggesting lesion is not functionally significant IMPRESSION: 1. CTFFR suggests obstructive CAD in mid LAD. Cardiac catheterization recommended Electronically Signed   By: Catherine Nanas M.D.   On: 12/29/2023 15:41   ECHOCARDIOGRAM STRESS TEST Result Date: 12/16/2023     EXERCISE STRESS ECHO REPORT     -------------------------------------------------------------------------------- Patient Name:   Catherine Haley  Date of Exam: 12/13/2023 Medical Rec #:  [id number]     Height:       64.0 in Accession #:    [id number]    Weight:       179.6 lb Date of Birth:  1966/04/27     BSA:          1.869 m Patient Age:    58 years      BP:           152/74 mmHg Patient Gender: F             HR:           63 bpm. Exam Location:  [address] Procedure: Stress Echo and Intracardiac Opacification Agent Indications:    R06.02 SOB  History:        Patient has no prior history of Echocardiogram examinations.  Sonographer:    Elsie Bohr RDCS Referring Phys: CYRUS JENNIFER SAUNDERS [facility] IMPRESSIONS  1. This is an inconclusive stress echocardiogram for ischemia.  2. This is a low risk study. Conclusion(s)/Recommendation(s): ECG is positive for ischemia, with horizontal ST changes at peak exercise. There is borderline abnormalities in ST pattern at rest, so this could be nonspecific result. There is no echo evidence of ischemia based on imaging. Discordant study, but overall low risk given lack of wall motion abnormalities and good exercise capacity on test.  FINDINGS Exam Protocol: The patient exercised on a treadmill according to a Bruce protocol. Definity  contrast agent was given IV to delineate the left ventricular endocardial borders.  Patient Performance: The patient exercised for 10 minutes achieving 11.7 METS. The maximum stage achieved was III of the Bruce protocol. The baseline heart rate was 63 bpm. The heart rate at peak stress was 151 bpm. The target heart rate was calculated to be 137 bpm. The percentage of maximum predicted heart rate achieved was 93.9 %. The baseline blood pressure was 152/74 mmHg. The blood pressure at peak stress was 201/83 mmHg. The blood pressure response was hypertensive. The patient developed fatigue  during the stress exam. The symptoms resolved with rest. The patient's functional capacity was above  average.  EKG: Resting EKG showed normal sinus rhythm with nonspecific ST-T wave changes. The patient developed ST-T segment changes during exercise. There was 1.0 mm of horizontal ST segment depression in lead(s) II, III and aVF and V4,V5, and V6.  2D Echo Findings: The baseline ejection fraction was 60%. The peak ejection fraction at stress was 80%. Baseline regional wall motion abnormalities were not present. This is an inconclusive stress echocardiogram for ischemia. Despite the above described EKG changes, there were no corresponding echocardiographic signs of ischemia.  Shelda Bruckner MD Electronically signed on 12/16/2023 at 7:14:28 AM    Final     Disposition   Pt is being discharged home today in good condition.  Follow-up Plans & Appointments     Discharge Instructions     Amb Referral to Cardiac Rehabilitation   Complete by: As directed    Diagnosis: Coronary Stents   After initial evaluation and assessments completed: Virtual Based Care may be provided alone or in conjunction with Phase 2 Cardiac Rehab based on patient barriers.: Yes   Intensive Cardiac Rehabilitation (ICR) [facility] location only OR Traditional Cardiac Rehabilitation (TCR) *If criteria for ICR are not met will enroll in TCR [facility] only): Yes        Discharge Medications   Allergies as of 01/06/2024   No Known Allergies  Medication List     TAKE these medications    aspirin  EC 81 MG tablet Take 1 tablet (81 mg total) by mouth daily. Swallow whole.   clopidogrel  75 MG tablet Commonly known as: Plavix  Take 1 tablet (75 mg total) by mouth daily.   DHEA 10 MG Caps Take 10 mg by mouth daily.   FISH OIL PO Take 1 tablet by mouth daily.   nitroGLYCERIN  0.4 MG SL tablet Commonly known as: NITROSTAT  Place 1 tablet (0.4 mg total) under the tongue every 5 (five) minutes as needed.   rosuvastatin  10 MG tablet Commonly known as: CRESTOR  Take 2 tablets (20 mg total) by mouth daily. What changed:  how much to take   VITAMIN C PO Take 1 tablet by mouth daily.   VITAMIN D PO Take 1 tablet by mouth daily.        Allergies No Known Allergies  Outstanding Labs/Studies   FLP/LFTs in 8 weeks   Duration of Discharge Encounter   Greater than 30 minutes including physician time.  Signed, Manuelita Rummer, NP 01/06/2024, 1:57 PM

## 2024-01-06 NOTE — Progress Notes (Signed)
 Pt was educated on stent card, stent location, Antiplatelet and ASA use, wt restrictions, no baths/daily wash-ups, s/s of infection, ex guidelines, s/s to stop exercising, NTG use and calling 911, heart healthy diet, risk factors and CRPII. Pt received materials on exercise, diet, and CRPII. Will refer to [facility].   Pt is interested in CR  Catherine Haley 01/06/2024 11:13 AM

## 2024-01-06 NOTE — Progress Notes (Signed)
 TR BAND REMOVAL  LOCATION:    right radial  DEFLATED PER PROTOCOL:    Yes.    TIME BAND OFF / DRESSING APPLIED: 01/06/24 at 1110   SITE UPON ARRIVAL:    Level 0  SITE AFTER BAND REMOVAL:    Level 0  CIRCULATION SENSATION AND MOVEMENT:    Within Normal Limits   Yes.    COMMENTS:

## 2024-01-06 NOTE — Brief Op Note (Signed)
 BRIEF CARDIAC CATHETERIZATION NOTE  DATE: 01/06/2024  TIME: 9:00 AM  PATIENT:  Catherine Haley  58 y.o. female  PRE-OPERATIVE DIAGNOSIS:  Abnormal cardiac CTA  POST-OPERATIVE DIAGNOSIS:  Same  PROCEDURE:  Procedure(s): LEFT HEART CATH AND CORONARY ANGIOGRAPHY (N/A) CORONARY STENT INTERVENTION (N/A)  SURGEON:  Surgeons and Role:    * Jordana Dugue, MD - Primary  FINDINGS: Significant two-vessel CAD with sequential 80-90% mid LAD stenoses as well as 80-90% OM1 and 60-70% mid LCx disease. Normal LVEF. Successful PCI to mid LAD using Synergy 2.5 x 32 mm drug-eluting stent with 0% residual stenosis and TIMI-3 flow.  RECOMMENDATIONS: DAPT with ASA and clopidogrel  for at least 6 months. Aggressive secondary prevention of CAD; favor medical management of OM/LCx disease. Anticipate same-day PCI in 6 hours if no post-PCI complications occur.  [name], MD [facility]

## 2024-01-07 ENCOUNTER — Encounter (HOSPITAL_COMMUNITY): Payer: Self-pay | Admitting: Internal Medicine

## 2024-01-10 ENCOUNTER — Telehealth (HOSPITAL_COMMUNITY): Payer: Self-pay

## 2024-01-10 NOTE — Telephone Encounter (Signed)
 Pt is not interested at this time in the cardiac rehab program. Pt will call if anything changes. I adv pt that she has up to a year from her even to participate.   Closed referral.

## 2024-01-16 NOTE — Progress Notes (Unsigned)
 Cardiology Office Note   Date:  01/17/2024  ID:  Catherine Haley, DOB Oct 18, 1965, MRN [id number] PCP: Oris Camie BRAVO, NP  [facility] Providers Cardiologist:  Jennifer JONELLE Crape, MD   History of Present Illness Catherine Haley is a 58 y.o. female with a past medical history of paroxysmal atrial fibrillation and hypertriglyceridemia here for follow-up appointment.  She was evaluated 12/31/2023 for dyspnea exertion.  CT coronary angiogram revealed significant coronary plaque obstructive coronary artery disease in the LAD.  She denied any chest pain, orthopnea, and PND at that time.  She does take care of daily activities of living.  She was in no acute distress at her last office visit.  Cardiac catheterization was discussed and ultimately performed showing 2 vessel significant coronary artery disease.  She ended up with successful PCI to mid LAD using Synergy XD 2.5 x 32 mm drug-eluting stent.  She had normal LVEF of 55 to 65%.  Today, she presents with atrial fibrillation and coronary artery disease for follow-up after coronary stent placement.  Coronary stent placement was performed due to significant blockages in the LAD and circumflex artery. Post-procedure, she has no chest pain or dyspnea. A brief episode of palpitations occurred after the procedure.  Current medications include aspirin  and Plavix . She has not taken Crestor  20 mg due to concerns about small particle cholesterol and family history of vascular dementia. She is interested in LP(a) level testing before considering statin therapy.  Family history includes vascular dementia in her father and recent cardiac issues in her brother. She follows a wellness plan with dietary changes and regular physical activity, which has improved her lipid profile. She is considering cardiac rehabilitation but has concerns about the environment and work schedule.  Reports no shortness of breath nor dyspnea on exertion. Reports no chest pain,  pressure, or tightness. No edema, orthopnea, PND. Reports no palpitations.   Discussed the use of AI scribe software for clinical note transcription with the patient, who gave verbal consent to proceed.   ROS: pertinent ROS in HPI  Studies Reviewed EKG Interpretation Date/Time:  Friday January 17 2024 08:25:50 EDT Ventricular Rate:  57 PR Interval:  144 QRS Duration:  84 QT Interval:  424 QTC Calculation: 412 R Axis:   74  Text Interpretation: Sinus bradycardia When compared with ECG of 06-Jan-2024 08:47, No significant change was found Confirmed by Lucien Blanc [phone]) on 01/17/2024 8:56:12 AM   Cardiac Cath 01/06/24  Left Main  Vessel is moderate in size. Vessel is angiographically normal.    Left Anterior Descending  Vessel is moderate in size.  Prox LAD lesion is 25% stenosed.  Mid LAD-1 lesion is 85% stenosed.  Mid LAD-2 lesion is 70% stenosed.  Dist LAD-1 lesion is 40% stenosed.  Dist LAD-2 lesion is 70% stenosed.    First Diagonal Branch  Vessel is small in size.    Second Diagonal Branch  Vessel is moderate in size.    Left Circumflex  Vessel is moderate in size.  Mid Cx lesion is 65% stenosed.    First Obtuse Marginal Branch  Vessel is moderate in size.  1st Mrg lesion is 85% stenosed.    Second Obtuse Marginal Branch  Vessel is moderate in size.    Third Obtuse Marginal Branch  Vessel is small in size.    Right Coronary Artery  Vessel is moderate in size. There is mild diffuse disease throughout the vessel.    Right Posterior Descending Artery  Vessel is moderate in size.  Right Posterior Atrioventricular Artery  Vessel is moderate in size.    First Right Posterolateral Branch  Vessel is small in size.    Second Right Posterolateral Branch  Vessel is moderate in size.    Intervention   Mid LAD-1 lesion  Stent (Also treats lesions: Mid LAD-2)  Lesion length: 28 mm. CATH LAUNCHER 6FR EBU3.5 guide catheter was inserted. Lesion crossed with  guidewire using a WIRE RUNTHROUGH .[id number]. Pre-stent angioplasty was performed using a BALLOON SAPPHIRE 2.0X15. Maximum pressure: 10 atm. A drug-eluting stent was successfully placed using a STENT SYNERGY XD 2.50X32. Maximum pressure: 14 atm. Stent strut is well apposed. Post-stent angioplasty was performed using a BALLOON SAPPHIRE NC24 [id number]. Maximum pressure: 18 atm.  Post-Intervention Lesion Assessment  The intervention was successful. Pre-interventional TIMI flow is 3. Post-intervention TIMI flow is 3. No complications occurred at this lesion.  There is a 0% residual stenosis post intervention.    Mid LAD-2 lesion  Stent (Also treats lesions: Mid LAD-1)  See details in Mid LAD-1 lesion.  Post-Intervention Lesion Assessment  The intervention was successful. Pre-interventional TIMI flow is 3. Post-intervention TIMI flow is 3. No complications occurred at this lesion.  There is a 0% residual stenosis post intervention.     Wall Motion              Left Heart  Left Ventricle The left ventricular size is normal. The left ventricular systolic function is normal. LV end diastolic pressure is normal. LVEDP 15 mmHg. The left ventricular ejection fraction is 55-65% by visual estimate. No regional wall motion abnormalities.  Aortic Valve There is no aortic valve stenosis.   Coronary Diagrams  Diagnostic Dominance: Right  Intervention     Risk Assessment/Calculations  CHA2DS2-VASc Score = 2  This indicates a 2.2% annual risk of stroke. The patient's score is based upon: CHF History: 0 HTN History: 0 Diabetes History: 0 Stroke History: 0 Vascular Disease History: 1 Age Score: 0 Gender Score: 1             Physical Exam VS:  BP 130/72   Pulse (!) 57   Ht 5' 4 (1.626 m)   Wt 173 lb 12.8 oz (78.8 kg)   SpO2 97%   BMI 29.83 kg/m        Wt Readings from Last 3 Encounters:  01/17/24 173 lb 12.8 oz (78.8 kg)  01/06/24 172 lb (78 kg)  12/31/23 176 lb 1.3 oz (79.9  kg)    GEN: Well nourished, well developed in no acute distress NECK: No JVD; No carotid bruits CARDIAC: RRR, no murmurs, rubs, gallops RESPIRATORY:  Clear to auscultation without rales, wheezing or rhonchi  ABDOMEN: Soft, non-tender, non-distended EXTREMITIES:  No edema; No deformity CATH SITE: some ecchymosis on her right wrist extending to her right thumb  ASSESSMENT AND PLAN  Coronary artery disease status post stent placement Diffuse coronary artery disease with significant stenosis in the LAD and circumflex arteries. Stents placed in the LAD. Circumflex artery stenosis too tortuous for stenting. No post-procedure chest pain or dyspnea. Dual antiplatelet therapy ongoing. - Continue aspirin  and Plavix  for one year post-stent placement. - Discuss cardiac rehabilitation options, including benefits of monitored exercise and nutritional guidance. - Order lipid panel and LP(a) test to evaluate hyperlipidemia.  Atrial fibrillation Atrial fibrillation detected by Apple Watch, leading to cardiac evaluation. Minimal symptoms post-stent placement. -NSR today -if Afib is detected again anticoagulation will need to be initated - CHA2DS2-VASc score of 2  Hyperlipidemia with evaluation  for elevated lipoprotein(a) Concerns about familial hyperlipidemia and potential elevated LP(a). Not taking Crestor  due to concerns about small particle cholesterol and family history of vascular dementia. Discussed alternative treatments if LP(a) elevated. - Order LP(a) test and lipid panel. - Discuss potential use of Repatha or Praluent if LP(a) is elevated. - Set up appointment with PharmD for further discussion on alternative therapies.  Access site bruising and pain post-cardiac catheterization Bruising and pain at access site post-cardiac catheterization with residual discoloration and soreness. - Apply warm compresses to the access site if sore.     Dispo: She can keep her follow-up with Dr.  Edwyna  Signed, Orren LOISE Fabry, PA-C

## 2024-01-17 ENCOUNTER — Ambulatory Visit: Attending: Physician Assistant | Admitting: Physician Assistant

## 2024-01-17 ENCOUNTER — Encounter: Payer: Self-pay | Admitting: Physician Assistant

## 2024-01-17 VITALS — BP 130/72 | HR 57 | Ht 64.0 in | Wt 173.8 lb

## 2024-01-17 DIAGNOSIS — I480 Paroxysmal atrial fibrillation: Secondary | ICD-10-CM

## 2024-01-17 DIAGNOSIS — I2510 Atherosclerotic heart disease of native coronary artery without angina pectoris: Secondary | ICD-10-CM

## 2024-01-17 DIAGNOSIS — E669 Obesity, unspecified: Secondary | ICD-10-CM | POA: Diagnosis not present

## 2024-01-17 DIAGNOSIS — E781 Pure hyperglyceridemia: Secondary | ICD-10-CM

## 2024-01-17 NOTE — Patient Instructions (Signed)
 Medication Instructions:  Your physician recommends that you continue on your current medications as directed. Please refer to the Current Medication list given to you today.  *If you need a refill on your cardiac medications before your next appointment, please call your pharmacy*  Lab Work: TODAY: Lipid panel, LP(a) If you have labs (blood work) drawn today and your tests are completely normal, you will receive your results only by: MyChart Message (if you have MyChart) OR A paper copy in the mail If you have any lab test that is abnormal or we need to change your treatment, we will call you to review the results.  Follow-Up: At [facility], you and your health needs are our priority.  As part of our continuing mission to provide you with exceptional heart care, our providers are all part of one team.  This team includes your primary Cardiologist (physician) and Advanced Practice Providers or APPs (Physician Assistants and Nurse Practitioners) who all work together to provide you with the care you need, when you need it.  Your next appointment:   1 month(s)  Provider:   Jennifer JONELLE Crape, MD   We recommend signing up for the patient portal called MyChart.  Sign up information is provided on this After Visit Summary.  MyChart is used to connect with patients for Virtual Visits (Telemedicine).  Patients are able to view lab/test results, encounter notes, upcoming appointments, etc.  Non-urgent messages can be sent to your provider as well.    To learn more about what you can do with MyChart, go to [email or web address].

## 2024-01-18 ENCOUNTER — Encounter: Payer: Self-pay | Admitting: Physician Assistant

## 2024-01-18 LAB — LIPID PANEL
Chol/HDL Ratio: 3.6 ratio (ref 0.0–4.4)
Cholesterol, Total: 139 mg/dL (ref 100–199)
HDL: 39 mg/dL — ABNORMAL LOW (ref >39)
LDL Chol Calc (NIH): 83 mg/dL (ref 0–99)
Triglycerides: 89 mg/dL (ref 0–149)
VLDL Cholesterol Cal: 17 mg/dL (ref 5–40)

## 2024-01-18 LAB — LIPOPROTEIN A (LPA): Lipoprotein (a): 23.3 nmol/L (ref <75.0)

## 2024-01-21 ENCOUNTER — Ambulatory Visit: Payer: Self-pay | Admitting: Physician Assistant

## 2024-01-28 MED ORDER — CLOPIDOGREL BISULFATE 75 MG PO TABS: 75.0000 mg | ORAL_TABLET | Freq: Every day | ORAL | 3 refills | Status: DC

## 2024-01-28 NOTE — Telephone Encounter (Signed)
 Sent in refill for patient on Plavix .

## 2024-01-31 NOTE — Progress Notes (Signed)
 Shingrix : had already at [facility] Pneumonia: no Flu: no flu  Last PAP: no obgyn no PAP today per pt.   Catheline Doing, DNP, AGNP-c [facility] [address] [place], [place] [address] Main Office [phone] VISIT TYPE: CPE on 02/03/2024 Today's Vitals   02/03/24 0819  BP: 134/88  Pulse: 78  Weight: 172 lb (78 kg)  Height: 5' 4.25 (1.632 m)   Body mass index is 29.29 kg/m. BP 134/88   Pulse 78   Ht 5' 4.25 (1.632 m)   Wt 172 lb (78 kg)   BMI 29.29 kg/m   Subjective:    Patient ID: Catherine Haley, female    DOB: 07/06/1965, 58 y.o.   MRN: [id number]  HPI: History of Present Illness Catherine Haley is a 58 year old female who presents for her annual physical exam.  She recently underwent cardiac stent placement due to significant stenosis in her coronary arteries, including 80-90% stenosis in the LID and OM1, and 60-70% in the mid LCX. She is currently on Plavix  and aspirin  as recommended after her procedure. She has no current symptoms of chest pain, shortness of breath, or dizziness. Her watch indicated she had AFib, which led to the discovery of her coronary blockages. She does not frequently feel the AFib, but notes it occurs more often at night and on weekends.   She was also started on statin therapy, but she has elected not to start this at this time. She is concerned about taking a medication that she will likely be on for life without knowing if it will truly help with the cause of her elevated triglyceride levels. Her LDL goal is less than 55, most recently she was at 89.   Her family history is significant for heart disease, with her brother having had an LAD MI in March. She is actively engaged in improving her health through dietary changes and participation in a wellness program, which has led to improvements in her cholesterol and triglyceride levels. Her HDL remains low.   She has a history of prediabetes, with her A1c levels gradually improving  over the past three years.   She experiences bruising, which she attributes to her current medication regimen.   No swelling in her feet or ankles, vaginal bleeding, or changes in bowel or bladder habits. She is actively engaged in improving her health through diet and regular walking.  Pertinent items are noted in HPI.  Most Recent Depression Screen:     02/03/2024    8:19 AM 09/26/2023    2:33 PM 01/24/2023    8:25 AM 01/22/2022    8:13 AM 11/29/2021    8:00 PM  Depression screen PHQ 2/9  Decreased Interest 0 0 0 1 0  Down, Depressed, Hopeless 0 0 0 0 0  PHQ - 2 Score 0 0 0 1 0  Altered sleeping    0   Tired, decreased energy    0   Change in appetite    0   Feeling bad or failure about yourself     1   Trouble concentrating    0   Moving slowly or fidgety/restless    0   Suicidal thoughts    0   PHQ-9 Score    2   Difficult doing work/chores    Not difficult at all    Most Recent Anxiety Screen:      No data to display         Most Recent Fall Screen:  02/03/2024    8:18 AM 09/26/2023    2:33 PM 01/24/2023    8:25 AM 01/22/2022    8:13 AM 11/29/2021    8:00 PM  Fall Risk   Falls in the past year? 0 0 0 0 0  Number falls in past yr: 0 0 0 0 0  Injury with Fall? 0 0 0 0 0  Risk for fall due to : No Fall Risks No Fall Risks No Fall Risks No Fall Risks No Fall Risks  Follow up Falls evaluation completed Falls evaluation completed Falls evaluation completed Falls evaluation completed;Education provided  Falls evaluation completed      Data saved with a previous flowsheet row definition    Past medical history, surgical history, medications, allergies, family history and social history reviewed with patient today and changes made to appropriate areas of the chart.  Past Medical History:  Past Medical History:  Diagnosis Date   Dyspnea on exertion 12/31/2023   Encounter for annual physical exam 01/22/2022   GERD (gastroesophageal reflux disease) 02/2021   Hepatic steatosis  03/14/2021   By ultrasound for elevated lfts 02/2021   History of cholelithiasis 11/29/2021   Hypertriglyceridemia 02/09/2019   Mild; rec fish oil bid 01/2019   Obesity (BMI 30-39.9) 01/28/2018   Paroxysmal atrial fibrillation (HCC) 09/30/2023   Pre-diabetes 01/24/2023   Medications:  Current Outpatient Medications on File Prior to Visit  Medication Sig   Ascorbic Acid (VITAMIN C PO) Take 1 tablet by mouth daily.   aspirin  EC 81 MG tablet Take 1 tablet (81 mg total) by mouth daily. Swallow whole.   clopidogrel  (PLAVIX ) 75 MG tablet Take 1 tablet (75 mg total) by mouth daily.   DHEA 10 MG CAPS Take 10 mg by mouth daily.   nitroGLYCERIN  (NITROSTAT ) 0.4 MG SL tablet Place 1 tablet (0.4 mg total) under the tongue every 5 (five) minutes as needed.   Omega-3 Fatty Acids (FISH OIL PO) Take 1 tablet by mouth daily.   VITAMIN D PO Take 1 tablet by mouth daily.   rosuvastatin  (CRESTOR ) 10 MG tablet Take 2 tablets (20 mg total) by mouth daily. (Patient not taking: Reported on 02/03/2024)   No current facility-administered medications on file prior to visit.   Surgical History:  Past Surgical History:  Procedure Laterality Date   CHOLECYSTECTOMY N/A 06/28/2021   Procedure: LAPAROSCOPIC CHOLECYSTECTOMY WITH INTRAOPERATIVE CHOLANGIOGRAM;  Surgeon: Curvin Deward MOULD, MD;  Location[facility] OR;  Service: General;  Laterality: N/A;   CORONARY STENT INTERVENTION N/A 01/06/2024   Procedure: CORONARY STENT INTERVENTION;  Surgeon: Mady Bruckner, MD;  Location: [facility] INVASIVE CV LAB;  Service: Cardiovascular;  Laterality: N/A;   EYE SURGERY Bilateral    lasix   LEFT HEART CATH AND CORONARY ANGIOGRAPHY N/A 01/06/2024   Procedure: LEFT HEART CATH AND CORONARY ANGIOGRAPHY;  Surgeon: Mady Bruckner, MD;  Location: [facility] INVASIVE CV LAB;  Service: Cardiovascular;  Laterality: N/A;   MANDIBLE FRACTURE SURGERY     Allergies:  No Known Allergies Family History:  Family History  Problem Relation Age of Onset   Heart  disease Mother 8   Diverticulitis Mother    Breast cancer Mother        postmenopausal   Cancer Mother    Hyperlipidemia Father    Hypertension Father    Arthritis Father    Colon cancer Father 9   Heart disease Father 11       CABG   Cancer Father    Heart disease Maternal Grandmother  Heart disease Maternal Grandfather    Heart disease Paternal Grandmother    Alcohol abuse Paternal Grandmother    Heart disease Paternal Grandfather    Alcohol abuse Paternal Grandfather    Hypertension Brother        Objective:    BP 134/88   Pulse 78   Ht 5' 4.25 (1.632 m)   Wt 172 lb (78 kg)   BMI 29.29 kg/m   Wt Readings from Last 3 Encounters:  02/03/24 172 lb (78 kg)  01/17/24 173 lb 12.8 oz (78.8 kg)  01/06/24 172 lb (78 kg)    Physical Exam Vitals and nursing note reviewed.  Constitutional:      General: She is not in acute distress.    Appearance: Normal appearance.  HENT:     Head: Normocephalic and atraumatic.     Right Ear: Hearing, tympanic membrane, ear canal and external ear normal.     Left Ear: Hearing, tympanic membrane, ear canal and external ear normal.     Nose: Nose normal.     Right Sinus: No maxillary sinus tenderness or frontal sinus tenderness.     Left Sinus: No maxillary sinus tenderness or frontal sinus tenderness.     Mouth/Throat:     Lips: Pink.     Mouth: Mucous membranes are moist.     Pharynx: Oropharynx is clear.  Eyes:     General: Lids are normal. Vision grossly intact.     Extraocular Movements: Extraocular movements intact.     Conjunctiva/sclera: Conjunctivae normal.     Pupils: Pupils are equal, round, and reactive to light.     Funduscopic exam:    Right eye: Red reflex present.        Left eye: Red reflex present.    Visual Fields: Right eye visual fields normal and left eye visual fields normal.  Neck:     Thyroid: No thyromegaly.     Vascular: No carotid bruit.  Cardiovascular:     Rate and Rhythm: Normal rate and  regular rhythm.     Chest Wall: PMI is not displaced.     Pulses: Normal pulses.          Dorsalis pedis pulses are 2+ on the right side and 2+ on the left side.       Posterior tibial pulses are 2+ on the right side and 2+ on the left side.     Heart sounds: Normal heart sounds. No murmur heard. Pulmonary:     Effort: Pulmonary effort is normal. No respiratory distress.     Breath sounds: Normal breath sounds.  Abdominal:     General: Abdomen is flat. Bowel sounds are normal. There is no distension.     Palpations: Abdomen is soft. There is no hepatomegaly, splenomegaly or mass.     Tenderness: There is no abdominal tenderness. There is no right CVA tenderness, left CVA tenderness, guarding or rebound.     Hernia: No hernia is present.  Musculoskeletal:        General: Normal range of motion.     Cervical back: Full passive range of motion without pain, normal range of motion and neck supple. No tenderness.     Right lower leg: No edema.     Left lower leg: No edema.  Feet:     Left foot:     Toenail Condition: Left toenails are normal.  Lymphadenopathy:     Cervical: No cervical adenopathy.     Upper Body:  Right upper body: No supraclavicular adenopathy.     Left upper body: No supraclavicular adenopathy.  Skin:    General: Skin is warm and dry.     Capillary Refill: Capillary refill takes less than 2 seconds.     Nails: There is no clubbing.     Comments: Bruising on the left wrist on the posterior side and right wrist on the anterior side.   Neurological:     General: No focal deficit present.     Mental Status: She is alert and oriented to person, place, and time.     GCS: GCS eye subscore is 4. GCS verbal subscore is 5. GCS motor subscore is 6.     Sensory: Sensation is intact.     Motor: Motor function is intact.     Coordination: Coordination is intact.     Gait: Gait is intact.     Deep Tendon Reflexes: Reflexes are normal and symmetric.  Psychiatric:         Attention and Perception: Attention normal.        Mood and Affect: Mood normal.        Speech: Speech normal.        Behavior: Behavior normal. Behavior is cooperative.        Cognition and Memory: Cognition and memory normal.      Results for orders placed or performed in visit on 01/17/24  Lipoprotein A (LPA)   Collection Time: 01/17/24  9:16 AM  Result Value Ref Range   Lipoprotein (a) 23.3 <75.0 nmol/L  Lipid panel   Collection Time: 01/17/24  9:16 AM  Result Value Ref Range   Cholesterol, Total 139 100 - 199 mg/dL   Triglycerides 89 0 - 149 mg/dL   HDL 39 (L) >60 mg/dL   VLDL Cholesterol Cal 17 5 - 40 mg/dL   LDL Chol Calc (NIH) 83 0 - 99 mg/dL   Chol/HDL Ratio 3.6 0.0 - 4.4 ratio       Assessment & Plan:   Problem List Items Addressed This Visit     Obesity (BMI 30-39.9)   Weight has decreased with dietary changes and regular walking. Continued focus on weight management through diet and exercise. - Continue current diet and exercise regimen      Relevant Orders   CBC with Differential/Platelet   CMP14+EGFR   Hemoglobin A1c   Lipid panel   Insulin , Free and Total   Hypertriglyceridemia   Triglycerides have decreased with dietary changes. HDL remains low, reducing protective effects against LDL. Discussed dietary modifications to increase HDL, including healthy fats such as beans, nuts, seeds, berries, and oils like avocado and olive oil. - Continue dietary modifications to increase HDL - Monitor lipid levels regularly      Relevant Orders   OXIDIZED LDL   Hepatic steatosis   Repeat labs today for monitoring. Weight and cholesterol management for risk reduction strongly recommended.       Relevant Orders   CBC with Differential/Platelet   CMP14+EGFR   Lipid panel   Encounter for annual physical exam - Primary   CPE completed today. Review of HM activities and recommendations discussed and provided on AVS. Anticipatory guidance, diet, and exercise  recommendations provided. Medications, allergies, and hx reviewed and updated as necessary. Orders placed as listed below.  Plan: - Labs ordered. Will make changes as necessary based on results.  - I will review these results and send recommendations via MyChart or a telephone call.  - F/U with  CPE in 1 year or sooner for acute/chronic health needs as directed.        Pre-diabetes   A1c has improved over the last three years. Higher insulin  levels likely due to prediabetes. Undergoing further testing to assess insulin  resistance and other metabolic factors. - Order A1c and insulin  level tests - Continue monitoring blood sugar levels       Relevant Orders   CMP14+EGFR   Hemoglobin A1c   Insulin , Free and Total   Paroxysmal atrial fibrillation (HCC)   Intermittent AFib, more frequent at night and on weekends. No correlation with blockages. Increased risk of blood clot formation. Currently asymptomatic. Discussed potential triggers such as stress and alcohol and the importance of monitoring symptoms. - Monitor frequency and symptoms of AFib - Report any increase in frequency or symptoms to cardiology      Relevant Orders   CBC with Differential/Platelet   CMP14+EGFR   Lipid panel   S/P angioplasty with stent   Recent cardiac stent placement for significant stenosis in LID, OM1, and mid LCX. Currently on Plavix  and aspirin  for six months. No chest pain reported. Insurance covers Plavix  and aspirin . She has not started statin therapy and is exploring dietary changes and additional testing to understand underlying causes of stenosis/plaque build-up.  - Continue Plavix  and aspirin  for six months - Follow up with cardiology as scheduled - Labs pending. Requested oxidized LDL.  - Strongly encourage consideration of statin therapy for future protection of artery stenosis and plaque formation and reduction of plaque present.  - Continue with diet and exercise changes.      Relevant Orders    OXIDIZED LDL    Follow up plan: Return in about 1 year (around 02/02/2025) for CPE.  NEXT PREVENTATIVE PHYSICAL DUE IN 1 YEAR.  PATIENT COUNSELING PROVIDED FOR ALL ADULT PATIENTS: A well balanced diet low in saturated fats, cholesterol, and moderation in carbohydrates.  This can be as simple as monitoring portion sizes and cutting back on sugary beverages such as soda and juice to start with.    Daily water  consumption of at least 64 ounces.  Physical activity at least 180 minutes per week.  If just starting out, start 10 minutes a day and work your way up.   This can be as simple as taking the stairs instead of the elevator and walking 2-3 laps around the office  purposefully every day.   STD protection, partner selection, and regular testing if high risk.  Limited consumption of alcoholic beverages if alcohol is consumed. For men, I recommend no more than 14 alcoholic beverages per week, spread out throughout the week (max 2 per day). Avoid binge drinking or consuming large quantities of alcohol in one setting.  Please let me know if you feel you may need help with reduction or quitting alcohol consumption.   Avoidance of nicotine, if used. Please let me know if you feel you may need help with reduction or quitting nicotine use.   Daily mental health attention. This can be in the form of 5 minute daily meditation, prayer, journaling, yoga, reflection, etc.  Purposeful attention to your emotions and mental state can significantly improve your overall wellbeing  and  Health.  Please know that I am here to help you with all of your health care goals and am happy to work with you to find a solution that works best for you.  The greatest advice I have received with any changes in life are to take it  one step at a time, that even means if all you can focus on is the next 60 seconds, then do that and celebrate your victories.  With any changes in life, you will have set backs, and  that is OK. The important thing to remember is, if you have a set back, it is not a failure, it is an opportunity to try again! Screening Testing Mammogram Every 1 -2 years based on history and risk factors Starting at age 33 Pap Smear Ages 21-39 every 3 years Ages 17-65 every 5 years with HPV testing More frequent testing may be required based on results and history Colon Cancer Screening Every 1-10 years based on test performed, risk factors, and history Starting at age 70 Bone Density Screening Every 2-10 years based on history Starting at age 23 for women Recommendations for men differ based on medication usage, history, and risk factors AAA Screening One time ultrasound Men 73-83 years old who have every smoked Lung Cancer Screening Low Dose Lung CT every 12 months Age 79-80 years with a 30 pack-year smoking history who still smoke or who have quit within the last 15 years   Screening Labs Routine  Labs: Complete Blood Count (CBC), Complete Metabolic Panel (CMP), Cholesterol (Lipid Panel) Every 6-12 months based on history and medications May be recommended more frequently based on current conditions or previous results Hemoglobin A1c Lab Every 3-12 months based on history and previous results Starting at age 690 or earlier with diagnosis of diabetes, high cholesterol, BMI >26, and/or risk factors Frequent monitoring for patients with diabetes to ensure blood sugar control Thyroid Panel (TSH) Every 6 months based on history, symptoms, and risk factors May be repeated more often if on medication HIV One time testing for all patients 71 and older May be repeated more frequently for patients with increased risk factors or exposure Hepatitis C One time testing for all patients 44 and older May be repeated more frequently for patients with increased risk factors or exposure Gonorrhea, Chlamydia Every 12 months for all sexually active persons 13-24 years Additional monitoring  may be recommended for those who are considered high risk or who have symptoms Every 12 months for any woman on birth control, regardless of sexual activity PSA Men 75-61 years old with risk factors Additional screening may be recommended from age 44-69 based on risk factors, symptoms, and history  Vaccine Recommendations Tetanus Booster All adults every 10 years Flu Vaccine All patients 6 months and older every year COVID Vaccine All patients 12 years and older Initial dosing with booster May recommend additional booster based on age and health history HPV Vaccine 2 doses all patients age 69-26 Dosing may be considered for patients over 26 Shingles Vaccine (Shingrix ) 2 doses all adults 55 years and older Pneumonia (Pneumovax 23) All adults 65 years and older May recommend earlier dosing based on health history One year apart from Prevnar 63 Pneumonia (Prevnar 36) All adults 65 years and older Dosed 1 year after Pneumovax 23 Pneumonia (Prevnar 20) One time alternative to the two dosing of 13 and 23 For all adults with initial dose of 23, 20 is recommended 1 year later For all adults with initial dose of 13, 23 is still recommended as second option 1 year later

## 2024-02-03 ENCOUNTER — Encounter: Payer: Self-pay | Admitting: Nurse Practitioner

## 2024-02-03 ENCOUNTER — Ambulatory Visit: Payer: BC Managed Care – PPO | Admitting: Nurse Practitioner

## 2024-02-03 VITALS — BP 134/88 | HR 78 | Ht 64.25 in | Wt 172.0 lb

## 2024-02-03 DIAGNOSIS — I480 Paroxysmal atrial fibrillation: Secondary | ICD-10-CM

## 2024-02-03 DIAGNOSIS — K760 Fatty (change of) liver, not elsewhere classified: Secondary | ICD-10-CM | POA: Diagnosis not present

## 2024-02-03 DIAGNOSIS — Z0000 Encounter for general adult medical examination without abnormal findings: Secondary | ICD-10-CM

## 2024-02-03 DIAGNOSIS — Z95820 Peripheral vascular angioplasty status with implants and grafts: Secondary | ICD-10-CM | POA: Insufficient documentation

## 2024-02-03 DIAGNOSIS — R7303 Prediabetes: Secondary | ICD-10-CM

## 2024-02-03 DIAGNOSIS — E669 Obesity, unspecified: Secondary | ICD-10-CM | POA: Diagnosis not present

## 2024-02-03 DIAGNOSIS — E781 Pure hyperglyceridemia: Secondary | ICD-10-CM | POA: Diagnosis not present

## 2024-02-03 NOTE — Assessment & Plan Note (Signed)
 Repeat labs today for monitoring. Weight and cholesterol management for risk reduction strongly recommended.

## 2024-02-03 NOTE — Patient Instructions (Addendum)
 It was great to see you today!   Recommendations for general health maintenance: Pneumococcal Vaccine:  1 dose  Recommended for all adults over 50 and for younger individuals with certain health conditions  Protects against some of the most common pneumonia causing bacterial infections of the lungs You are considered higher risk of serious illness with pneumonia based on your age and cardiac (heart) history.   Pap Smear You are overdue for your pap smear for cervical cancer screening. I recommend you have this completed in the near future to ensure there are no concerns with cervical cancer.   Keep an eye on the atrial fibrillation and be sure to reach out to cardiology if this is occurring more frequently or if you are having symptoms.   I will let you know the results of your tests. If we need to change anything I will let you know.   Including healthy fats into your diet, such as those from beans, seeds, nuts, and even berries. Oils, like avocado oil and olive oil, are also beneficial to increase your HDL readings.

## 2024-02-03 NOTE — Assessment & Plan Note (Signed)
 Recent cardiac stent placement for significant stenosis in LID, OM1, and mid LCX. Currently on Plavix  and aspirin  for six months. No chest pain reported. Insurance covers Plavix  and aspirin . She has not started statin therapy and is exploring dietary changes and additional testing to understand underlying causes of stenosis/plaque build-up.  - Continue Plavix  and aspirin  for six months - Follow up with cardiology as scheduled - Labs pending. Requested oxidized LDL.  - Strongly encourage consideration of statin therapy for future protection of artery stenosis and plaque formation and reduction of plaque present.  - Continue with diet and exercise changes.

## 2024-02-03 NOTE — Assessment & Plan Note (Signed)
 Weight has decreased with dietary changes and regular walking. Continued focus on weight management through diet and exercise. - Continue current diet and exercise regimen

## 2024-02-03 NOTE — Assessment & Plan Note (Signed)

## 2024-02-03 NOTE — Assessment & Plan Note (Signed)
 Triglycerides have decreased with dietary changes. HDL remains low, reducing protective effects against LDL. Discussed dietary modifications to increase HDL, including healthy fats such as beans, nuts, seeds, berries, and oils like avocado and olive oil. - Continue dietary modifications to increase HDL - Monitor lipid levels regularly

## 2024-02-03 NOTE — Assessment & Plan Note (Signed)
 Intermittent AFib, more frequent at night and on weekends. No correlation with blockages. Increased risk of blood clot formation. Currently asymptomatic. Discussed potential triggers such as stress and alcohol and the importance of monitoring symptoms. - Monitor frequency and symptoms of AFib - Report any increase in frequency or symptoms to cardiology

## 2024-02-03 NOTE — Assessment & Plan Note (Signed)
 A1c has improved over the last three years. Higher insulin  levels likely due to prediabetes. Undergoing further testing to assess insulin  resistance and other metabolic factors. - Order A1c and insulin  level tests - Continue monitoring blood sugar levels

## 2024-02-05 ENCOUNTER — Ambulatory Visit: Payer: Self-pay | Admitting: Nurse Practitioner

## 2024-02-07 LAB — APOLIPOPROTEIN A-1: Apolipoprotein A-1: 112 mg/dL — AB (ref 116–209)

## 2024-02-07 LAB — SPECIMEN STATUS REPORT

## 2024-02-11 LAB — CBC WITH DIFFERENTIAL/PLATELET
Basophils Absolute: 0 x10E3/uL (ref 0.0–0.2)
Basos: 1 %
EOS (ABSOLUTE): 0.1 x10E3/uL (ref 0.0–0.4)
Eos: 2 %
Hematocrit: 42.5 % (ref 34.0–46.6)
Hemoglobin: 14 g/dL (ref 11.1–15.9)
Immature Grans (Abs): 0 x10E3/uL (ref 0.0–0.1)
Immature Granulocytes: 0 %
Lymphocytes Absolute: 1.6 x10E3/uL (ref 0.7–3.1)
Lymphs: 26 %
MCH: 29.9 pg (ref 26.6–33.0)
MCHC: 32.9 g/dL (ref 31.5–35.7)
MCV: 91 fL (ref 79–97)
Monocytes Absolute: 0.5 x10E3/uL (ref 0.1–0.9)
Monocytes: 8 %
Neutrophils Absolute: 3.9 x10E3/uL (ref 1.4–7.0)
Neutrophils: 63 %
Platelets: 311 x10E3/uL (ref 150–450)
RBC: 4.68 x10E6/uL (ref 3.77–5.28)
RDW: 12.5 % (ref 11.7–15.4)
WBC: 6.1 x10E3/uL (ref 3.4–10.8)

## 2024-02-11 LAB — LIPID PANEL
Chol/HDL Ratio: 4 ratio (ref 0.0–4.4)
Cholesterol, Total: 145 mg/dL (ref 100–199)
HDL: 36 mg/dL — ABNORMAL LOW (ref >39)
LDL Chol Calc (NIH): 92 mg/dL (ref 0–99)
Triglycerides: 91 mg/dL (ref 0–149)
VLDL Cholesterol Cal: 17 mg/dL (ref 5–40)

## 2024-02-11 LAB — CMP14+EGFR
ALT: 23 IU/L (ref 0–32)
AST: 22 IU/L (ref 0–40)
Albumin: 4.7 g/dL (ref 3.8–4.9)
Alkaline Phosphatase: 81 IU/L (ref 44–121)
BUN/Creatinine Ratio: 14 (ref 9–23)
BUN: 13 mg/dL (ref 6–24)
Bilirubin Total: 0.6 mg/dL (ref 0.0–1.2)
CO2: 24 mmol/L (ref 20–29)
Calcium: 10 mg/dL (ref 8.7–10.2)
Chloride: 104 mmol/L (ref 96–106)
Creatinine, Ser: 0.93 mg/dL (ref 0.57–1.00)
Globulin, Total: 2.1 g/dL (ref 1.5–4.5)
Glucose: 94 mg/dL (ref 70–99)
Potassium: 4.4 mmol/L (ref 3.5–5.2)
Sodium: 143 mmol/L (ref 134–144)
Total Protein: 6.8 g/dL (ref 6.0–8.5)
eGFR: 71 mL/min/1.73 (ref >59)

## 2024-02-11 LAB — OXIDIZED LDL: Oxidized LDL: 414 ng/mL — ABNORMAL HIGH (ref 10–170)

## 2024-02-11 LAB — HEMOGLOBIN A1C
Est. average glucose Bld gHb Est-mCnc: 108 mg/dL
Hgb A1c MFr Bld: 5.4 % (ref 4.8–5.6)

## 2024-02-11 LAB — INSULIN, FREE AND TOTAL
Free Insulin: 11 uU/mL
Total Insulin: 11 uU/mL

## 2024-02-18 ENCOUNTER — Ambulatory Visit: Admitting: Cardiology

## 2024-02-19 ENCOUNTER — Ambulatory Visit: Attending: Cardiology | Admitting: Pharmacist Clinician (PhC)/ Clinical Pharmacy Specialist

## 2024-02-19 ENCOUNTER — Encounter: Payer: Self-pay | Admitting: Pharmacist Clinician (PhC)/ Clinical Pharmacy Specialist

## 2024-02-19 DIAGNOSIS — E785 Hyperlipidemia, unspecified: Secondary | ICD-10-CM

## 2024-02-19 NOTE — Progress Notes (Signed)
 Office Visit    Patient Name: Catherine Haley Date of Encounter: 02/19/2024  Primary Care Provider:  Oris Camie BRAVO, NP Primary Cardiologist:  Jennifer JONELLE Crape, MD  Chief Complaint    Hyperlipidemia   Significant Past Medical History   AF Found on Apple Watch, then monitor, CHADS2-VASc = 1  CAD 8/25 CAC = 429, PCI to mLAD     No Known Allergies  History of Present Illness    Catherine Haley is a 58 y.o. female patient of Dr Crape, in the office today to discuss options for cholesterol management.  She was first seen in cardiology in May of this year after her Apple watch showed episodes of atrial fibrillation.  Follow up testing showed a coronary calcium  score of 429 (98th percentile) and total plaque volume of 662 (93rd percentile).   Left heart catheterization followed, with DES to mLAD.  She is in the office today with her husband.  They have many questions and he has been doing his own research.  They had her oxidized LDL level measured, which resulted at 414 (range 0-60).  They have been working to change her diet, decreasing processed foods and seed oils, and she takes red yeast rice.  She was prescribed rosuvastatin  10 mg, but has yet to start, as she has many questions.    LDL Cholesterol goal:  LDL < 70  Current Medications:  red yeast rice daily (has rosuvastatin , has not yet started)   Family Hx:  father died at 83 from MI, first at 50; mother has stent, now 57, brother had MI in Feb now 3, other brother with htn;   Social Hx: Tobacco: no Alcohol: not often  Diet:    eats mostly at home; cooking from scratch; protein fish and chicken, some vegetables, snacks on nuts, occasional fruit, doesn't snack much   Exercise: walks 4-5 days couple of miles   Accessory Clinical Findings   Lab Results  Component Value Date   CHOL 145 02/03/2024   HDL 36 (L) 02/03/2024   LDLCALC 92 02/03/2024   LDLDIRECT 134.0 07/04/2020   TRIG 91 02/03/2024   CHOLHDL 4.0 02/03/2024     Lipoprotein (a)  Date/Time Value Ref Range Status  01/17/2024 09:16 AM 23.3 <75.0 nmol/L Final    Comment:    Note:  Values greater than or equal to 75.0 nmol/L may        indicate an independent risk factor for CHD,        but must be evaluated with caution when applied        to non-Caucasian populations due to the        influence of genetic factors on Lp(a) across        ethnicities.     Lab Results  Component Value Date   ALT 23 02/03/2024   AST 22 02/03/2024   ALKPHOS 81 02/03/2024   BILITOT 0.6 02/03/2024   Lab Results  Component Value Date   CREATININE 0.93 02/03/2024   BUN 13 02/03/2024   NA 143 02/03/2024   K 4.4 02/03/2024   CL 104 02/03/2024   CO2 24 02/03/2024   Lab Results  Component Value Date   HGBA1C 5.4 02/03/2024    Home Medications    Current Outpatient Medications  Medication Sig Dispense Refill   Ascorbic Acid (VITAMIN C PO) Take 1 tablet by mouth daily.     aspirin  EC 81 MG tablet Take 1 tablet (81 mg total) by mouth daily. Swallow  whole. 90 tablet 3   clopidogrel  (PLAVIX ) 75 MG tablet Take 1 tablet (75 mg total) by mouth daily. 90 tablet 3   DHEA 10 MG CAPS Take 10 mg by mouth daily.     nitroGLYCERIN  (NITROSTAT ) 0.4 MG SL tablet Place 1 tablet (0.4 mg total) under the tongue every 5 (five) minutes as needed. 25 tablet 6   Omega-3 Fatty Acids (FISH OIL PO) Take 1 tablet by mouth daily.     VITAMIN D PO Take 1 tablet by mouth daily.     rosuvastatin  (CRESTOR ) 10 MG tablet Take 2 tablets (20 mg total) by mouth daily. (Patient not taking: Reported on 02/03/2024)     No current facility-administered medications for this visit.     Assessment & Plan    Hyperlipidemia LDL goal <70 Assessment: Patient with ASCVD not at LDL goal of < 70 Most recent LDL 92 on 02/03/24 Has not yet started rosuvastatin  10 mg, is taking red yeast rice.   Discussed recent labs and importance of lowering LDL cholesterol.  Answered all questions from patient and spouse  about cholesterol, diet and need to decrease risk due to high CAC and family history.   Plan: Patient still considering starting rosuvastatin .  Agreed that is okay to start with 5 mg 2-3 days per week and increase regularly.   Stressed that if oxidized LDL is a marker of inflammation, taking a statin should help reduce the inflammation.  Repeat labs after:  3 months Lipid Liver function    Allean Mink, PharmD CPP [facility] [address]   [place], [place] [address] [phone]  02/19/2024, 11:22 AM

## 2024-02-19 NOTE — Progress Notes (Unsigned)
 Cardiology Office Note   Date:  02/20/2024  ID:  Ledonna Dormer, DOB [birth year], MRN [id number] PCP: Oris Camie BRAVO, NP  [facility] Providers Cardiologist:  Jennifer JONELLE Crape, MD   History of Present Illness Catherine Haley is a 58 y.o. female with a past medical history of paroxysmal atrial fibrillation and hypertriglyceridemia here for follow-up appointment.  She was evaluated 12/31/2023 for dyspnea exertion.  CT coronary angiogram revealed significant coronary plaque obstructive coronary artery disease in the LAD.  She denied any chest pain, orthopnea, and PND at that time.  She does take care of daily activities of living.  She was in no acute distress at her last office visit.  Cardiac catheterization was discussed and ultimately performed showing 2 vessel significant coronary artery disease.  She ended up with successful PCI to mid LAD using Synergy XD 2.5 x 32 mm drug-eluting stent.  She had normal LVEF of 55 to 65%.  I saw her 8/22, she presents with hx of atrial fibrillation and coronary artery disease for follow-up after coronary stent placement.  Coronary stent placement was performed due to significant blockages in the LAD and circumflex artery. Post-procedure, she has no chest pain or dyspnea. A brief episode of palpitations occurred after the procedure.  Current medications include aspirin  and Plavix . She has not taken Crestor  20 mg due to concerns about small particle cholesterol and family history of vascular dementia. She is interested in LP(a) level testing before considering statin therapy.  Family history includes vascular dementia in her father and recent cardiac issues in her brother. She follows a wellness plan with dietary changes and regular physical activity, which has improved her lipid profile. She is considering cardiac rehabilitation but has concerns about the environment and work schedule.  Reports no shortness of breath nor dyspnea on exertion. Reports no chest  pain, pressure, or tightness. No edema, orthopnea, PND. Reports no palpitations.   Discussed the use of AI scribe software for clinical note transcription with the patient, who gave verbal consent to proceed.  Today, she presents with coronary artery disease s/p PCI for follow-up regarding her cardiac health and medication management.  She underwent a cardiac catheterization with no pain at the site, though significant bruising was present. She is considering cholesterol management options, including pitavastatin or Zipidemag, due to high oxidized cholesterol levels on her recent lipid panel, which raises concerns about soft plaque development. She is actively making lifestyle modifications, such as dietary changes to avoid seed oils, increasing fish intake, and incorporating exercise, including resistance and blood flow restriction training.  Reports no shortness of breath nor dyspnea on exertion. Reports no chest pain, pressure, or tightness. No edema, orthopnea, PND. Reports no palpitations.   Discussed the use of AI scribe software for clinical note transcription with the patient, who gave verbal consent to proceed.   ROS: pertinent ROS in HPI  Studies Reviewed     Cardiac Cath 01/06/24  Left Main  Vessel is moderate in size. Vessel is angiographically normal.    Left Anterior Descending  Vessel is moderate in size.  Prox LAD lesion is 25% stenosed.  Mid LAD-1 lesion is 85% stenosed.  Mid LAD-2 lesion is 70% stenosed.  Dist LAD-1 lesion is 40% stenosed.  Dist LAD-2 lesion is 70% stenosed.    First Diagonal Branch  Vessel is small in size.    Second Diagonal Branch  Vessel is moderate in size.    Left Circumflex  Vessel is moderate in size.  Mid  Cx lesion is 65% stenosed.    First Obtuse Marginal Branch  Vessel is moderate in size.  1st Mrg lesion is 85% stenosed.    Second Obtuse Marginal Branch  Vessel is moderate in size.    Third Obtuse Marginal Branch  Vessel is  small in size.    Right Coronary Artery  Vessel is moderate in size. There is mild diffuse disease throughout the vessel.    Right Posterior Descending Artery  Vessel is moderate in size.    Right Posterior Atrioventricular Artery  Vessel is moderate in size.    First Right Posterolateral Branch  Vessel is small in size.    Second Right Posterolateral Branch  Vessel is moderate in size.    Intervention   Mid LAD-1 lesion  Stent (Also treats lesions: Mid LAD-2)  Lesion length: 28 mm. CATH LAUNCHER 6FR EBU3.5 guide catheter was inserted. Lesion crossed with guidewire using a WIRE RUNTHROUGH .[id number]. Pre-stent angioplasty was performed using a BALLOON SAPPHIRE 2.0X15. Maximum pressure: 10 atm. A drug-eluting stent was successfully placed using a STENT SYNERGY XD 2.50X32. Maximum pressure: 14 atm. Stent strut is well apposed. Post-stent angioplasty was performed using a BALLOON SAPPHIRE NC24 [id number]. Maximum pressure: 18 atm.  Post-Intervention Lesion Assessment  The intervention was successful. Pre-interventional TIMI flow is 3. Post-intervention TIMI flow is 3. No complications occurred at this lesion.  There is a 0% residual stenosis post intervention.    Mid LAD-2 lesion  Stent (Also treats lesions: Mid LAD-1)  See details in Mid LAD-1 lesion.  Post-Intervention Lesion Assessment  The intervention was successful. Pre-interventional TIMI flow is 3. Post-intervention TIMI flow is 3. No complications occurred at this lesion.  There is a 0% residual stenosis post intervention.     Wall Motion              Left Heart  Left Ventricle The left ventricular size is normal. The left ventricular systolic function is normal. LV end diastolic pressure is normal. LVEDP 15 mmHg. The left ventricular ejection fraction is 55-65% by visual estimate. No regional wall motion abnormalities.  Aortic Valve There is no aortic valve stenosis.   Coronary Diagrams  Diagnostic Dominance:  Right  Intervention     Risk Assessment/Calculations  CHA2DS2-VASc Score = 2  This indicates a 2.2% annual risk of stroke. The patient's score is based upon: CHF History: 0 HTN History: 0 Diabetes History: 0 Stroke History: 0 Vascular Disease History: 1 Age Score: 0 Gender Score: 1             Physical Exam VS:  BP 124/80   Pulse 65   Ht 5' 4 (1.626 m)   Wt 169 lb (76.7 kg)   SpO2 98%   BMI 29.01 kg/m        Wt Readings from Last 3 Encounters:  02/20/24 169 lb (76.7 kg)  02/03/24 172 lb (78 kg)  01/17/24 173 lb 12.8 oz (78.8 kg)    GEN: Well nourished, well developed in no acute distress NECK: No JVD; No carotid bruits CARDIAC: RRR, no murmurs, rubs, gallops RESPIRATORY:  Clear to auscultation without rales, wheezing or rhonchi  ABDOMEN: Soft, non-tender, non-distended EXTREMITIES:  No edema; No deformity CATH SITE: some ecchymosis on her right wrist extending to her right thumb  ASSESSMENT AND PLAN  Atherosclerotic heart disease of native coronary artery with prior stent placement Significant stenosis in LAD with mixed plaque in mid LAD and LCX, noncalcified plaque in OM1. Risk of further stenosis in  other coronary arteries. Statin therapy crucial to prevent further plaque accumulation. Lifestyle changes essential to reduce risk. Discussed statin therapy options, including pitavastatin and generic alternatives. - Consider nonstatin options such as Nexlizet, Praluent, Repatha, and Leqvio if statins not tolerated. - Encouraged lifestyle modifications including diet and exercise. - Recommended cardiac rehabilitation or self-directed exercise program. - Would not initiate blood flow restrictive therapy at this time while taking aspirin , Plavix , and with recent PCI  Hyperlipidemia in the context of atherosclerotic cardiovascular disease Elevated LDL contributing to atherosclerotic cardiovascular disease. Statins recommended to lower LDL and reduce inflammation.  Nonstatin options available if statins not tolerated. Monitoring lipid levels every three months important to assess progress. Oxidized cholesterol plays role in soft plaque formation. Dietary changes emphasized to reduce LDL and inflammation. - Repeat lipid panel every three months. - Discuss statin therapy options and potential side effects. - Consider nonstatin options if statins not tolerated. - Encourage dietary changes to reduce LDL and inflammation.  Atrial fibrillation Atrial fibrillation detected by Apple Watch, leading to cardiac evaluation. Minimal symptoms post-stent placement. -NSR today -if Afib is detected again anticoagulation will need to be initated - CHA2DS2-VASc score of 2.    Dispo: She can follow-up with Dr. Kriste in 3 months  Signed, Orren LOISE Fabry, PA-C

## 2024-02-19 NOTE — Patient Instructions (Addendum)
 Your Results:             Your most recent labs Goal  Total Cholesterol 145 < 200  Triglycerides 91 < 150  HDL (happy/good cholesterol) 36 > 40  LDL (lousy/bad cholesterol 92 < 70   Medication changes:  Try 5-10 mg of rosuvastatin .  You can take two to three times weekly.   Lab orders:  We want to repeat labs after 2-3 months.  We will send you a lab order to remind you once we get closer to that time.     Thank you for choosing [facility]

## 2024-02-19 NOTE — Assessment & Plan Note (Signed)
 Assessment: Patient with ASCVD not at LDL goal of < 70 Most recent LDL 92 on 02/03/24 Has not yet started rosuvastatin  10 mg, is taking red yeast rice.   Discussed recent labs and importance of lowering LDL cholesterol.  Answered all questions from patient and spouse about cholesterol, diet and need to decrease risk due to high CAC and family history.   Plan: Patient still considering starting rosuvastatin .  Agreed that is okay to start with 5 mg 2-3 days per week and increase regularly.   Stressed that if oxidized LDL is a marker of inflammation, taking a statin should help reduce the inflammation.  Repeat labs after:  3 months Lipid Liver function

## 2024-02-20 ENCOUNTER — Ambulatory Visit: Attending: Cardiology | Admitting: Physician Assistant

## 2024-02-20 ENCOUNTER — Encounter: Payer: Self-pay | Admitting: Physician Assistant

## 2024-02-20 VITALS — BP 124/80 | HR 65 | Ht 64.0 in | Wt 169.0 lb

## 2024-02-20 DIAGNOSIS — I2510 Atherosclerotic heart disease of native coronary artery without angina pectoris: Secondary | ICD-10-CM | POA: Diagnosis not present

## 2024-02-20 DIAGNOSIS — E781 Pure hyperglyceridemia: Secondary | ICD-10-CM

## 2024-02-20 DIAGNOSIS — I480 Paroxysmal atrial fibrillation: Secondary | ICD-10-CM | POA: Diagnosis not present

## 2024-02-20 NOTE — Patient Instructions (Signed)
 Medication Instructions:  Your physician recommends that you continue on your current medications as directed. Please refer to the Current Medication list given to you today.   *If you need a refill on your cardiac medications before your next appointment, please call your pharmacy*  Lab Work: Lipids in December If you have labs (blood work) drawn today and your tests are completely normal, you will receive your results only by: MyChart Message (if you have MyChart) OR A paper copy in the mail If you have any lab test that is abnormal or we need to change your treatment, we will call you to review the results.   Follow-Up: At [facility], you and your health needs are our priority.  As part of our continuing mission to provide you with exceptional heart care, our providers are all part of one team.  This team includes your primary Cardiologist (physician) and Advanced Practice Providers or APPs (Physician Assistants and Nurse Practitioners) who all work together to provide you with the care you need, when you need it.  Your next appointment:   3 month(s)  Provider:   Emeline Calender, MD    We recommend signing up for the patient portal called MyChart.  Sign up information is provided on this After Visit Summary.  MyChart is used to connect with patients for Virtual Visits (Telemedicine).  Patients are able to view lab/test results, encounter notes, upcoming appointments, etc.  Non-urgent messages can be sent to your provider as well.   To learn more about what you can do with MyChart, go to [email or web address].   Other Instructions Please send us  a copy of your lab work

## 2024-03-04 MED ORDER — ASPIRIN 81 MG PO TBEC: 81.0000 mg | DELAYED_RELEASE_TABLET | Freq: Every day | ORAL | 3 refills | Status: AC

## 2024-03-04 MED ORDER — CLOPIDOGREL BISULFATE 75 MG PO TABS: 75.0000 mg | ORAL_TABLET | Freq: Every day | ORAL | 3 refills | Status: AC

## 2024-05-13 ENCOUNTER — Encounter: Payer: Self-pay | Admitting: Physician Assistant

## 2024-05-25 ENCOUNTER — Encounter: Payer: Self-pay | Admitting: *Deleted

## 2024-05-27 ENCOUNTER — Ambulatory Visit: Attending: Internal Medicine | Admitting: Internal Medicine

## 2024-05-27 ENCOUNTER — Encounter: Payer: Self-pay | Admitting: Internal Medicine

## 2024-05-27 VITALS — BP 124/64 | HR 70 | Ht 63.5 in | Wt 157.0 lb

## 2024-05-27 DIAGNOSIS — Z955 Presence of coronary angioplasty implant and graft: Secondary | ICD-10-CM

## 2024-05-27 DIAGNOSIS — I480 Paroxysmal atrial fibrillation: Secondary | ICD-10-CM | POA: Diagnosis not present

## 2024-05-27 DIAGNOSIS — I2510 Atherosclerotic heart disease of native coronary artery without angina pectoris: Secondary | ICD-10-CM | POA: Diagnosis not present

## 2024-05-27 DIAGNOSIS — R0683 Snoring: Secondary | ICD-10-CM

## 2024-05-27 DIAGNOSIS — E785 Hyperlipidemia, unspecified: Secondary | ICD-10-CM | POA: Diagnosis not present

## 2024-05-27 NOTE — Progress Notes (Signed)
 " Cardiology Office Note   Date:  05/27/2024  ID:  Catherine Haley, DOB 1965-07-14, MRN [id number] PCP: Oris Camie BRAVO, NP  [facility] Providers Cardiologist:  Emeline BRAVO Calender, DO     History of Present Illness Catherine Haley is a 58 y.o. female who presents today with her husband with a past medical history of paroxysmal atrial fibrillation, dyslipidemia, multivessel CAD s/p DES to mid LAD 01/06/2024 with residual LCx disease due to significant tortuosity who presents today for 32-month follow-up.  States that she initially noted A-fib but after her Apple Watch reported this prompting her to initially seek medical care.  She then underwent cardiac monitoring which showed an 8% atrial fibrillation burden.  She underwent a echocardiogram stress test which was nonconclusive and underwent coronary CTA which showed obstructive CAD in mid LAD.   And underwent left heart catheterization which showed two-vessel CAD in the mid LAD as well as LCx/OM1 which was tortuous and had PCI to the LAD but not to the LCx due to tortuosity.  Regarding her CAD and dyslipidemia, she has been very hesitant to start on any statins after doing much research.  We had a nice and extensive discussion regarding statins.  She has been trying to make some lifestyle changes and would like to recheck her oxidized LDL and lipid panel to see if that has been helping her.  She has been taking aspirin  and Plavix  daily and has not skipped doses.  She has not any chest pain or any other complaints.  Regarding her atrial fibrillation, her Apple Watch notes that over the past 6 months she has had up to a 35% atrial fibrillation burden and 23% A-fib burden over the past 1 month.  She is not on anticoagulation due to CHA2DS2-VASc of 2.  She is a non-smoker and rarely drinks alcohol.  No known history of sleep apnea but has never been tested.  Husband states she does snore frequently at night.     ROS:  Review of Systems  All other  systems reviewed and are negative.   Physical Exam  Physical Exam Vitals and nursing note reviewed.  Constitutional:      Appearance: Normal appearance.  HENT:     Head: Normocephalic and atraumatic.  Eyes:     Conjunctiva/sclera: Conjunctivae normal.  Cardiovascular:     Rate and Rhythm: Normal rate and regular rhythm.  Pulmonary:     Effort: Pulmonary effort is normal.     Breath sounds: Normal breath sounds.  Musculoskeletal:        General: No swelling or tenderness.  Skin:    Coloration: Skin is not jaundiced or pale.  Neurological:     Mental Status: She is alert.     VS:  BP 124/64   Pulse 70   Ht 5' 3.5 (1.613 m)   Wt 157 lb (71.2 kg)   SpO2 98%   BMI 27.38 kg/m         Wt Readings from Last 3 Encounters:  05/27/24 157 lb (71.2 kg)  02/20/24 169 lb (76.7 kg)  02/03/24 172 lb (78 kg)     EKG Interpretation Date/Time:    Ventricular Rate:    PR Interval:    QRS Duration:    QT Interval:    QTC Calculation:   R Axis:      Text Interpretation:      Studies Reviewed   Prior CV Studies: LEFT HEART CATH AND CORONARY ANGIOGRAPHY, LEFT HEART CATH AND CORONARY  ANGIOGRAPHY 01/06/2024  Conclusion Conclusions: Significant two-vessel coronary artery disease with sequential 80-90% mid LAD stenoses as well as 80-90% OM1 and 60-70% mid LCx disease. Normal left ventricular systolic function (LVEF 55-65%) with upper normal filling pressure (LVEDP 15 mmHg). Successful PCI to mid LAD using Synergy XD 2.5 x 32 mm drug-eluting stent with 0% residual stenosis and TIMI-3 flow.  Recommendations: Dual antiplatelet therapy with aspirin  and clopidogrel  for at least 6 months. Aggressive secondary prevention of coronary artery disease; favor medical management of OM/LCx disease unless the patient develops refractory angina. Anticipate same-day PCI in 6 hours if no post-PCI complications occur.  [name], MD [facility]   CT CORONARY [facility] W/CTA COR  W/SCORE W/CA W/CM &/OR WO/CM 12/27/2023  Addendum 12/31/2023  3:14 PM ADDENDUM REPORT: 12/31/2023 15:11  ADDENDUM: The following report is an over-read performed by radiologist Dr. Reyes Holder of [facility] on 12/31/2023. This over-read does not include interpretation of cardiac or coronary anatomy or pathology. The coronary calcium  score/coronary CTA interpretation by the cardiologist is attached.  COMPARISON:  None.  FINDINGS: Vascular: No acute non-cardiac vascular finding.  Mediastinum/Nodes: Gas fluid levels in a patulous esophagus. Prominent/mildly enlarged mediastinal lymph nodes and hilar nodal tissue for instance a right paratracheal lymph node measuring 9 mm in short axis on image 9/308 and a right hilar lymph node measuring 10 mm in short axis on image 87/308.  Lungs/Pleura: Scattered pulmonary nodules for instance in the left lower lobe measuring 5 mm on image 81/309 and in the right lower lobe measuring 7 mm on image 121/309.  Upper Abdomen: Visualized portions of the upper abdomen are unremarkable.  Musculoskeletal: There are no aggressive appearing lytic or blastic lesions noted in the visualized portions of the skeleton.  IMPRESSION: 1. Scattered pulmonary nodules measuring up to 7 mm in the right lower lobe. Suggest further evaluation by dedicated chest CT in 1-2 months. 2. Prominent/mildly enlarged mediastinal lymph nodes and hilar nodal tissue, nonspecific but possibly reactive. Suggest attention on follow-up imaging. 3. Gas fluid levels in a patulous esophagus, suggestive of gastroesophageal reflux.   Electronically Signed By: Reyes Holder M.D. On: 12/31/2023 15:11  Narrative CLINICAL DATA:  Chest pain  EXAM: Cardiac/Coronary CTA  TECHNIQUE: A non-contrast, gated CT scan was obtained with axial slices of 2.5 mm through the heart for calcium  scoring. Calcium  scoring was performed using the Agatston method. A 120 kV  prospective, gated, contrast cardiac CT scan was obtained. Gantry rotation speed was 230 msec and collimation was 0.63 mm. Two sublingual nitroglycerin  tablets (0.8 mg) were given. The 3D data set was reconstructed with motion correction for the best systolic or diastolic phase. Images were analyzed on a dedicated workstation using MPR, MIP, and VRT modes. The patient received 95 cc of contrast.  FINDINGS: Image quality: Excellent.  Noise artifact is: Limited.  Coronary Arteries:  Normal coronary origin.  Right dominance.  Left main: The left main is a large caliber vessel with a normal take off from the left coronary cusp that bifurcates to form a left anterior descending artery and a left circumflex artery. Calcified plaque in left main causes 0-24% stenosis  Left anterior descending artery: The LAD is patent. The LAD gives off 3 patent diagonal branches. Mixed plaque in proximal LAD causes 25-49% stenosis. Mixed plaque in mid LAD causes 70-99% stenosis  Left circumflex artery: The LCX is non-dominant and patent. The LCX gives off 2 patent obtuse marginal branches. Calcified plaque in proximal LCX causes 0-25% stenosis.  Mixed plaque in mid LCX causes 50-69% stenosis. Noncalcified plaque in OM1 causes 25-49% stenosis  Right coronary artery: The RCA is dominant with normal take off from the right coronary cusp. The RCA terminates as a PDA and right posterolateral branch. Calcified plaque in proximal RCA causes 25-49% stenosis. Calcified plaque in mid RCA causes 25-49% stenosis  Right Atrium: Right atrial size is within normal limits.  Right Ventricle: The right ventricular cavity is within normal limits.  Left Atrium: Left atrial size is normal in size with no left atrial appendage filling defect.  Left Ventricle: The ventricular cavity size is within normal limits.  Pulmonary arteries: Normal in size.  Pulmonary veins: Normal pulmonary venous drainage.  Pericardium:  Normal thickness without significant effusion or calcium  present.  Cardiac valves: The aortic valve is trileaflet without significant calcification. The mitral valve is normal without significant calcification.  Aorta: Normal caliber without significant disease.  Extra-cardiac findings: See attached radiology report for non-cardiac structures.  IMPRESSION: 1. Coronary calcium  score of 429. This was 98th percentile for age-, sex, and race-matched controls.  2. Total plaque volume 639mm3 which is 93rd percentile for age- and sex-matched controls (calcified plaque 141mm3; non-calcified plaque 516mm3). TPV is severe  3.  Normal coronary origin with right dominance.  4.  Severe (70-99%) stenosis in mid LAD  5.  Moderate (50-69%) stenosis in mid LCX  6. Mild (25-49%) stenosis in proximal LAD, proximal OM1, and proximal/mid RCA  7.  Minimal (0-24%) stenosis in left main and proximal LCX  8.  Will send study for CTFFR  RECOMMENDATIONS: CAD-RADS 4: Severe stenosis. (70-99% or > 50% left main). Cardiac catheterization or CT FFR is recommended. Consider symptom-guided anti-ischemic pharmacotherapy as well as risk factor modification per guideline directed care. Invasive coronary angiography recommended with revascularization per published guideline statements.  Electronically Signed: By: Lonni Nanas M.D. On: 12/29/2023 15:37      Risk Assessment/Calculations   CHA2DS2-VASc Score = 2   This indicates a 2.2% annual risk of stroke. The patient's score is based upon: CHF History: 0 HTN History: 0 Diabetes History: 0 Stroke History: 0 Vascular Disease History: 1 Age Score: 0 Gender Score: 1         STOP-Bang Score:  2       ASCVD risk score: The 10-year ASCVD risk score (Arnett DK, et al., 2019) is: 2.9%   Values used to calculate the score:     Age: 65 years     Clinically relevant sex: Female     Is Non-Hispanic African American: No     Diabetic:  No     Tobacco smoker: No     Systolic Blood Pressure: 124 mmHg     Is BP treated: No     HDL Cholesterol: 36 mg/dL     Total Cholesterol: 145 mg/dL   ASSESSMENT  Two-vessel CAD in the mid LAD as well as obstructive CAD in LCx/OM1 which was tortuous s/p PCI to the mid LAD but not to the LCx due to tortuosity tolerating dual antiplatelet therapy with aspirin  and Plavix .  Not on high intensity statin due to patient preference.  She has been doing lifestyle modification to control her CAD risk and would like to recheck her lipid panel and oxidized LDL before starting on a statin. Dyslipidemia with elevated oxidized LDL declining statins as above Asymptomatic paroxysmal atrial fibrillation 8% atrial fibrillation burden on Zio patch however her Apple watch notes up to 35% A-fib burden over the past 6  months and 23% burden over the past 1 month.  Though CHA2DS2-VASc score is 2 (sex, ischemic heart disease) she does have a high A-fib burden and would still recommend starting on anticoagulation.  She wishes to do more research on this first Snoring Family history of premature CAD in her brother   Plan  Strongly advised to consider starting on a high intensity statin given her underlying CAD for secondary prevention as well as residual LCx disease.  After extensive discussion, patient wishes to recheck her lipid panel and oxidized LDL to see if this is still elevated after her lifestyle modification and consider starting on a statin after that Continue dual antiplatelet therapy with aspirin  and Plavix  for at least 6 months with aggressive secondary prevention Would recommend starting on anticoagulation for her atrial fibrillation given her high A-fib burden.  If she starts on Eliquis or Xarelto would discontinue aspirin  and continue Plavix  at that time.  We discussed risks and benefits of this medicine.  She wishes to do some more research on this before starting Patient snores and has A-fib with a  STOP-BANG score: 2.  Will check a home sleep study Will get a baseline echocardiogram given her A-fib and CAD Cardiac risk counseling and prevention recommendations: Heart healthy/Mediterranean diet with whole grains, fruits, vegetable, fish, lean meats, nuts, and olive oil. Limit salt. Moderate walking, 3-5 times/week for 30-50 minutes each session. Aim for at least 150 minutes.week. Goal should be pace of 3 miles/hour, or walking 1.5 miles in 30 minutes Avoidance of tobacco products. Avoid excess alcohol.  Follow up: 6 months or sooner          Signed, Emeline FORBES Calender, DO   "

## 2024-05-27 NOTE — Patient Instructions (Addendum)
 Medication Instructions:   No changes   Recommend  you consider  these Medications  Eliquis ( blood thinner)  and Rosuvastatin  ( cholesterol )   *If you need a refill on your cardiac medications before your next appointment, please call your pharmacy*   Lab Work: fasting  LIPID Oxizide LDL  If you have labs (blood work) drawn today and your tests are completely normal, you will receive your results only by: MyChart Message (if you have MyChart) OR A paper copy in the mail If you have any lab test that is abnormal or we need to change your treatment, we will call you to review the results.   Testing/Procedures:                                          Your physician has recommended that you have a  home sleep study-- WatchPAT. This test records several body functions during sleep,    Follow-Up: At [facility], you and your health needs are our priority.  As part of our continuing mission to provide you with exceptional heart care, we have created designated Provider Care Teams.  These Care Teams include your primary Cardiologist (physician) and Advanced Practice Providers (APPs -  Physician Assistants and Nurse Practitioners) who all work together to provide you with the care you need, when you need it.     Your next appointment:   6 month(s)  The format for your next appointment:   In Person  Provider:   Emeline FORBES Calender, DO   Other Instructions   WatchPAT?  Is a FDA cleared portable home sleep study test that uses a watch and 3 points of contact to monitor 7 different channels, including your heart rate, oxygen saturations, body position, snoring, and chest motion.  The study is easy to use from the comfort of your own home and accurately detect sleep apnea.  Before bed, you attach the chest sensor, attached the sleep apnea bracelet to your nondominant hand, and attach the finger probe.  After the study, the raw data is downloaded from the watch and scored for apnea events.    For more information: [email or web address]  Patient Testing Instructions:  Do not put battery into the device until bedtime when you are ready to begin the test. Please call the support number if you need assistance after following the instructions below: 24 hour support line- [phone] or [facility] support at [phone] (option 2)  Download the [employer] WatchPAT One app through the google play store or App Store  Be sure to turn on or enable access to bluetooth in settlings on your smartphone/ device  Make sure no other bluetooth devices are on and within the vicinity of your smartphone/ device and WatchPAT watch during testing.  Make sure to leave your smart phone/ device plugged in and charging all night.  When ready for bed:  Follow the instructions step by step in the WatchPAT One App to activate the testing device. For additional instructions, including video instruction, visit the WatchPAT One video on Youtube. You can search for WatchPat One within Youtube (video is 4 minutes and 18 seconds) or enter: https://[email or web address] Please note: You will be prompted to enter a Pin to connect via bluetooth when starting the test. The PIN will be assigned to you when you receive the test.  The device is disposable, but it recommended that you retain  the device until you receive a call letting you know the study has been received and the results have been interpreted.  We will let you know if the study did not transmit to us  properly after the test is completed. You do not need to call us  to confirm the receipt of the test.  Please complete the test within 48 hours of receiving PIN.   Frequently Asked Questions:  What is Watch Bruna one?  A single use fully disposable home sleep apnea testing device and will not need to be returned after completion.  What are the requirements to use WatchPAT one?  The be able to have a successful watchpat one sleep study, you should  have your Watch pat one device, your smart phone, watch pat one app, your PIN number and Internet access What type of phone do I need?  You should have a smart phone that uses Android 5.1 and above or any Iphone with IOS 10 and above How can I download the WatchPAT one app?  Based on your device type search for WatchPAT one app either in google play for android devices or APP store for Iphone's Where will I get my PIN for the study?  Your PIN will be provided by your physician's office. It is used for authentication and if you lose/forget your PIN, please reach out to your providers office.  I do not have Internet at home. Can I do WatchPAT one study?  WatchPAT One needs Internet connection throughout the night to be able to transmit the sleep data. You can use your home/local internet or your cellular's data package. However, it is always recommended to use home/local Internet. It is estimated that between 20MB-30MB will be used with each study.However, the application will be looking for space in the phone to start the study.  What happens if I lose internet or bluetooth connection?  During the internet disconnection, your phone will not be able to transmit the sleep data. All the data, will be stored in your phone. As soon as the internet connection is back on, the phone will being sending the sleep data. During the bluetooth disconnection, WatchPAT one will not be able to to send the sleep data to your phone. Data will be kept in the WatchPAT one until two devices have bluetooth connection back on. As soon as the connection is back on, WatchPAT one will send the sleep data to the phone.  How long do I need to wear the WatchPAT one?  After you start the study, you should wear the device at least 6 hours.  How far should I keep my phone from the device?  During the night, your phone should be within 15 feet.  What happens if I leave the room for restroom or other reasons?  Leaving the room  for any reason will not cause any problem. As soon as your get back to the room, both devices will reconnect and will continue to send the sleep data. Can I use my phone during the sleep study?  Yes, you can use your phone as usual during the study. But it is recommended to put your watchpat one on when you are ready to go to bed.  How will I get my study results?  A soon as you completed your study, your sleep data will be sent to the provider. They will then share the results with you when they are ready.

## 2024-06-01 ENCOUNTER — Ambulatory Visit: Payer: Self-pay | Admitting: Internal Medicine

## 2024-06-01 LAB — LIPID PANEL
Chol/HDL Ratio: 2.6 ratio (ref 0.0–4.4)
Cholesterol, Total: 113 mg/dL (ref 100–199)
HDL: 44 mg/dL
LDL Chol Calc (NIH): 56 mg/dL (ref 0–99)
Triglycerides: 60 mg/dL (ref 0–149)
VLDL Cholesterol Cal: 13 mg/dL (ref 5–40)

## 2024-06-01 LAB — OXIDIZED LDL: Oxidized LDL: 77 ng/mL (ref 10–170)

## 2024-06-02 ENCOUNTER — Ambulatory Visit (HOSPITAL_COMMUNITY)
Admission: RE | Admit: 2024-06-02 | Discharge: 2024-06-02 | Disposition: A | Discharge status: Home / Self Care | Source: Ambulatory Visit | Attending: Vascular Surgery | Admitting: Vascular Surgery

## 2024-06-02 DIAGNOSIS — I480 Paroxysmal atrial fibrillation: Secondary | ICD-10-CM

## 2024-06-02 LAB — ECHOCARDIOGRAM COMPLETE
AR max vel: 1.71 cm2
AV Area VTI: 1.85 cm2
AV Area mean vel: 1.96 cm2
AV Mean grad: 5 mmHg
AV Peak grad: 9.9 mmHg
Ao pk vel: 1.57 m/s
Area-P 1/2: 3.26 cm2
S' Lateral: 2.27 cm

## 2025-02-22 ENCOUNTER — Encounter: Payer: Self-pay | Admitting: Nurse Practitioner
